# Patient Record
Sex: Female | Born: 1995 | Race: Black or African American | Hispanic: No | Smoking: Former smoker
Health system: Southern US, Community
[De-identification: ages and names within clinical notes are randomized; demographics above are authoritative.]

## PROBLEM LIST (undated history)

## (undated) ENCOUNTER — Inpatient Hospital Stay (HOSPITAL_COMMUNITY): Payer: Self-pay

## (undated) DIAGNOSIS — E669 Obesity, unspecified: Secondary | ICD-10-CM

## (undated) DIAGNOSIS — Z789 Other specified health status: Secondary | ICD-10-CM

## (undated) HISTORY — PX: NO PAST SURGERIES: SHX2092

---
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
## 2016-08-08 ENCOUNTER — Emergency Department (HOSPITAL_BASED_OUTPATIENT_CLINIC_OR_DEPARTMENT_OTHER)
Admission: EM | Admit: 2016-08-08 | Discharge: 2016-08-08 | Discharge status: Against Medical Advice | Payer: Medicaid Other | Attending: Emergency Medicine | Admitting: Emergency Medicine

## 2016-08-08 ENCOUNTER — Encounter (HOSPITAL_BASED_OUTPATIENT_CLINIC_OR_DEPARTMENT_OTHER): Payer: Self-pay | Admitting: *Deleted

## 2016-08-08 DIAGNOSIS — R300 Dysuria: Secondary | ICD-10-CM | POA: Diagnosis not present

## 2016-08-08 DIAGNOSIS — F17200 Nicotine dependence, unspecified, uncomplicated: Secondary | ICD-10-CM | POA: Diagnosis not present

## 2016-08-08 DIAGNOSIS — M549 Dorsalgia, unspecified: Secondary | ICD-10-CM | POA: Diagnosis not present

## 2016-08-08 DIAGNOSIS — N898 Other specified noninflammatory disorders of vagina: Secondary | ICD-10-CM | POA: Insufficient documentation

## 2016-08-08 DIAGNOSIS — R350 Frequency of micturition: Secondary | ICD-10-CM | POA: Insufficient documentation

## 2016-08-08 LAB — URINALYSIS, ROUTINE W REFLEX MICROSCOPIC
Bilirubin Urine: NEGATIVE
GLUCOSE, UA: NEGATIVE mg/dL
Hgb urine dipstick: NEGATIVE
Ketones, ur: NEGATIVE mg/dL
NITRITE: NEGATIVE
PROTEIN: NEGATIVE mg/dL
Specific Gravity, Urine: 1.023 (ref 1.005–1.030)
pH: 7 (ref 5.0–8.0)

## 2016-08-08 LAB — URINE MICROSCOPIC-ADD ON

## 2016-08-08 LAB — WET PREP, GENITAL
Clue Cells Wet Prep HPF POC: NONE SEEN
SPERM: NONE SEEN
Trich, Wet Prep: NONE SEEN
Yeast Wet Prep HPF POC: NONE SEEN

## 2016-08-08 LAB — PREGNANCY, URINE: PREG TEST UR: NEGATIVE

## 2016-08-08 NOTE — ED Provider Notes (Signed)
[facility] Provider Note   CSN: [id number] Arrival date & time: 08/08/16  1916   By signing my name below, I, Christel MormonMatthew Jamison, attest that this documentation has been prepared under the direction and in the presence of Lavera Guiseana Duo Henriette Hesser, MD . Electronically Signed: Christel MormonMatthew Jamison, Scribe. 08/08/2016. 9:26 PM.   History   Chief Complaint Chief Complaint  Patient presents with  . Abdominal Pain    The history is provided by the patient. No language interpreter was used.  Vaginal Itching  This is a new problem. The current episode started more than 2 days ago. Episode frequency: intermittently. The problem has not changed since onset.Associated symptoms include abdominal pain. Nothing aggravates the symptoms. Nothing relieves the symptoms. She has tried nothing for the symptoms.   HPI Comments:  Jeanette [name] is a 20 y.o. female who presents to the Emergency Department with multiple complaints. Pt complains of vaginal itching and intermittent lower suprapbubic abdominal x 1 week. Pt is currently sexually active and has unprotected sex with 1 partner. No concern for STDs. Pt complains of associated dysuria and urinary frequency. Pt denies vaginal discharge, nausea, vomiting, diarrhea, fever or chills. Pt also complains of dry skin on her face the has been intermittently peeling for 3 months. Pt reports associated itchiness. Pt states that she frequently picks at her face. Pt has been using Cetaphil with some relief. Pt denies using any new creams.     Past Medical History:  Diagnosis Date  . Medical history non-contributory     There are no active problems to display for this patient.   Past Surgical History:  Procedure Laterality Date  . NO PAST SURGERIES      OB History    Gravida Para Term Preterm AB Living   0 0 0 0 0 0   SAB TAB Ectopic Multiple Live Births   0 0 0 0         Home Medications    Prior to Admission medications   Medication Sig Start Date  End Date Taking? Authorizing Provider  omeprazole (PRILOSEC) 20 MG capsule Take 1 capsule (20 mg total) by mouth daily. 12/04/15   Judeth HornErin Lawrence, NP  promethazine (PHENERGAN) 25 MG tablet Take 1 tablet (25 mg total) by mouth every 6 (six) hours as needed for nausea or vomiting. 12/04/15   Judeth HornErin Lawrence, NP    Family History No family history on file.  Reviewed. Not contributory.  Social History Social History  Substance Use Topics  . Smoking status: Current Every Day Smoker  . Smokeless tobacco: Never Used  . Alcohol use No     Allergies   Review of patient's allergies indicates no known allergies.   Review of Systems Review of Systems  Gastrointestinal: Positive for abdominal pain. Negative for diarrhea, nausea and vomiting.  Genitourinary: Positive for dysuria, frequency and vaginal pain. Negative for vaginal discharge.  Musculoskeletal: Positive for back pain.  Skin: Positive for rash.  All other systems reviewed and are negative.    Physical Exam Updated Vital Signs BP 131/73 (BP Location: Left Arm)   Pulse 77   Temp 98.3 F (36.8 C) (Oral)   Resp 16   Ht 5\' 3"  (1.6 m)   Wt 203 lb (92.1 kg)   SpO2 98%   BMI 35.96 kg/m   Physical Exam Physical Exam  Nursing note and vitals reviewed. Constitutional: Well developed, well nourished, non-toxic, and in no acute distress Head: Normocephalic and atraumatic.  Mouth/Throat: Oropharynx is clear  and moist.  Neck: Normal range of motion. Neck supple.  Cardiovascular: Normal rate and regular rhythm.   Pulmonary/Chest: Effort normal and breath sounds normal.  Abdominal: Soft. Minimal suprapubic tenderness to palpation. There is no rebound and no guarding.  Musculoskeletal: Normal range of motion.  Neurological: Alert, no facial droop, fluent speech, moves all extremities symmetrically Skin: Skin is warm and dry.  Psychiatric: Cooperative Pelvic: Normal external genitalia. Normal internal genitalia. White vaginal  discharge. No blood within the vagina. No cervical motion tenderness. No adnexal masses or tenderness.   ED Treatments / Results  DIAGNOSTIC STUDIES:  Oxygen Saturation is 98% on RA, normal by my interpretation.    COORDINATION OF CARE:  9:26 PM Discussed treatment plan with pt at bedside and pt agreed to plan.   Labs (all labs ordered are listed, but only abnormal results are displayed) Labs Reviewed  URINALYSIS, ROUTINE W REFLEX MICROSCOPIC (NOT AT [facility]) - Abnormal; Notable for the following:       Result Value   APPearance CLOUDY (*)    Leukocytes, UA SMALL (*)    All other components within normal limits  URINE MICROSCOPIC-ADD ON - Abnormal; Notable for the following:    Squamous Epithelial / LPF 0-5 (*)    Bacteria, UA FEW (*)    All other components within normal limits  WET PREP, GENITAL  PREGNANCY, URINE  GC/CHLAMYDIA PROBE AMP ([place]) NOT AT [facility]    EKG  EKG Interpretation None       Radiology No results found.  Procedures Procedures (including critical care time)  Medications Ordered in ED Medications - No data to display   Initial Impression / Assessment and Plan / ED Course  I have reviewed the triage vital signs and the nursing notes.  Pertinent labs & imaging results that were available during my care of the patient were reviewed by me and considered in my medical decision making (see chart for details).  Clinical Course    Presenting with vaginal itching, intermittent suprapubic pain and dysuria. Abdomen soft and benign. Vital signs stable. DDX include STD, yeast, UTI, BV.   UA not convincing for UTI and sent for culture. Wet prep negative with STD testing pending. Does not want empiric treatment. While waiting for results, patient eloped.   Final Clinical Impressions(s) / ED Diagnoses   Final diagnoses:  None    New Prescriptions New Prescriptions   No medications on file   Strict return and follow-up instructions reviewed.  She/He expressed understanding of all discharge instructions and felt comfortable with the plan of care.     Lavera Guise, MD 08/08/16 [phone]

## 2016-08-08 NOTE — ED Triage Notes (Signed)
Pt is here for sharp lower abdominal and vaginal itching x1 week.  Some pain and burning with urination. Pt reports recent unprotected sex  Pt also is concerned about her dry skin on her face which is peeling and has been this way for 3 months.  Pt states that the dry skin on her face is her primary reason for coming in

## 2016-08-09 LAB — GC/CHLAMYDIA PROBE AMP (~~LOC~~) NOT AT ARMC
Chlamydia: NEGATIVE
Neisseria Gonorrhea: NEGATIVE

## 2017-01-09 ENCOUNTER — Emergency Department (HOSPITAL_COMMUNITY)
Admission: EM | Admit: 2017-01-09 | Discharge: 2017-01-09 | Disposition: A | Discharge status: Home / Self Care | Payer: Medicaid Other | Attending: Emergency Medicine | Admitting: Emergency Medicine

## 2017-01-09 ENCOUNTER — Encounter (HOSPITAL_COMMUNITY): Payer: Self-pay | Admitting: *Deleted

## 2017-01-09 DIAGNOSIS — L600 Ingrowing nail: Secondary | ICD-10-CM

## 2017-01-09 DIAGNOSIS — F17200 Nicotine dependence, unspecified, uncomplicated: Secondary | ICD-10-CM | POA: Insufficient documentation

## 2017-01-09 MED ORDER — SULFAMETHOXAZOLE-TRIMETHOPRIM 800-160 MG PO TABS: 1.0000 | ORAL_TABLET | Freq: Two times a day (BID) | ORAL | 0 refills | Status: AC

## 2017-01-09 NOTE — ED Triage Notes (Signed)
Pt states tried to remove ingrown toenail to R great toe and now it is red and swollen.  Also c/o red marks/dry skin to breasts.

## 2017-01-09 NOTE — ED Provider Notes (Signed)
[facility] Provider Note   CSN: [id number] Arrival date & time: 01/09/17  7829     History   Chief Complaint Chief Complaint  Patient presents with  . Toe Pain    HPI Jeanette [name] is a 21 y.o. female.  The history is provided by the patient. No language interpreter was used.  Toe Pain  This is a recurrent problem. The problem occurs constantly. Nothing aggravates the symptoms. Nothing relieves the symptoms. She has tried nothing for the symptoms. The treatment provided no relief.  Pt reports she has   Past Medical History:  Diagnosis Date  . Medical history non-contributory     There are no active problems to display for this patient.   Past Surgical History:  Procedure Laterality Date  . NO PAST SURGERIES      OB History    Gravida Para Term Preterm AB Living   0 0 0 0 0 0   SAB TAB Ectopic Multiple Live Births   0 0 0 0         Home Medications    Prior to Admission medications   Medication Sig Start Date End Date Taking? Authorizing Provider  omeprazole (PRILOSEC) 20 MG capsule Take 1 capsule (20 mg total) by mouth daily. 12/04/15   Judeth Horn, NP  promethazine (PHENERGAN) 25 MG tablet Take 1 tablet (25 mg total) by mouth every 6 (six) hours as needed for nausea or vomiting. 12/04/15   Judeth Horn, NP  sulfamethoxazole-trimethoprim (BACTRIM DS,SEPTRA DS) 800-160 MG tablet Take 1 tablet by mouth 2 (two) times daily. 01/09/17 01/16/17  Elson Areas, PA-C    Family History No family history on file.  Social History Social History  Substance Use Topics  . Smoking status: Current Every Day Smoker  . Smokeless tobacco: Never Used  . Alcohol use No     Allergies   Patient has no known allergies.   Review of Systems Review of Systems  All other systems reviewed and are negative.    Physical Exam Updated Vital Signs BP 149/80 (BP Location: Right Arm)   Pulse 80   Temp 99.7 F (37.6 C) (Oral)   Resp 16   Ht 5\' 3"  (1.6 m)    Wt 92.1 kg   LMP 12/09/2016   SpO2 99%   BMI 35.96 kg/m   Physical Exam  Musculoskeletal: She exhibits tenderness.  Deformity right 1st toe,  ? Atrophic nail.  Erythematous.    Neurological: She is alert.  Skin: Skin is warm.  Psychiatric: She has a normal mood and affect.     ED Treatments / Results  Labs (all labs ordered are listed, but only abnormal results are displayed) Labs Reviewed - No data to display  EKG  EKG Interpretation None       Radiology No results found.  Procedures Procedures (including critical care time)  Medications Ordered in ED Medications - No data to display   Initial Impression / Assessment and Plan / ED Course  I have reviewed the triage vital signs and the nursing notes.  Pertinent labs & imaging results that were available during my care of the patient were reviewed by me and considered in my medical decision making (see chart for details).     I do not think removing nail will help.  Pt given rx rto bactrim and advised to see Podiatrist for treatment.  Final Clinical Impressions(s) / ED Diagnoses   Final diagnoses:  Ingrown right big toenail  New Prescriptions New Prescriptions   SULFAMETHOXAZOLE-TRIMETHOPRIM (BACTRIM DS,SEPTRA DS) 800-160 MG TABLET    Take 1 tablet by mouth 2 (two) times daily.    An After Visit Summary was printed and given to the patient. Elson AreasLeslie K Jeylin Woodmansee, PA-C 01/09/17 0912    Elson AreasLeslie K Crysta Gulick, PA-C 01/09/17 [phone]    Marily MemosJason Mesner, MD 01/09/17 [phone]

## 2017-01-11 ENCOUNTER — Ambulatory Visit: Payer: Medicaid Other | Admitting: Podiatry

## 2017-07-19 ENCOUNTER — Encounter (HOSPITAL_COMMUNITY): Payer: Self-pay | Admitting: *Deleted

## 2017-07-19 ENCOUNTER — Inpatient Hospital Stay (HOSPITAL_COMMUNITY)
Admission: AD | Admit: 2017-07-19 | Discharge: 2017-07-19 | Discharge status: Against Medical Advice | Payer: Medicaid Other | Source: Ambulatory Visit | Attending: Obstetrics and Gynecology | Admitting: Obstetrics and Gynecology

## 2017-07-19 DIAGNOSIS — Z5321 Procedure and treatment not carried out due to patient leaving prior to being seen by health care provider: Secondary | ICD-10-CM | POA: Insufficient documentation

## 2017-07-19 LAB — URINALYSIS, ROUTINE W REFLEX MICROSCOPIC
Bilirubin Urine: NEGATIVE
GLUCOSE, UA: NEGATIVE mg/dL
Ketones, ur: 80 mg/dL — AB
Leukocytes, UA: NEGATIVE
Nitrite: NEGATIVE
PH: 5 (ref 5.0–8.0)
Protein, ur: NEGATIVE mg/dL
SPECIFIC GRAVITY, URINE: 1.021 (ref 1.005–1.030)

## 2017-07-19 LAB — POCT PREGNANCY, URINE: Preg Test, Ur: POSITIVE — AB

## 2017-07-19 NOTE — MAU Note (Signed)
Pt C/O nausea & being lightheaded for the past week.  Has vomited once.  Had pos HPT yesterday.  Has lower abd & back pain, denies bleeding.

## 2017-07-19 NOTE — MAU Note (Signed)
Registration staff called to say that pt decided to leave AMA without signing AMA paperwork

## 2017-07-22 ENCOUNTER — Inpatient Hospital Stay (HOSPITAL_COMMUNITY)
Admission: AD | Admit: 2017-07-22 | Discharge: 2017-07-22 | Disposition: A | Discharge status: Home / Self Care | Payer: Self-pay | Source: Ambulatory Visit | Attending: Obstetrics & Gynecology | Admitting: Obstetrics & Gynecology

## 2017-07-22 ENCOUNTER — Encounter (HOSPITAL_COMMUNITY): Payer: Self-pay | Admitting: *Deleted

## 2017-07-22 ENCOUNTER — Inpatient Hospital Stay (HOSPITAL_COMMUNITY): Payer: Self-pay

## 2017-07-22 DIAGNOSIS — O219 Vomiting of pregnancy, unspecified: Secondary | ICD-10-CM | POA: Insufficient documentation

## 2017-07-22 DIAGNOSIS — R109 Unspecified abdominal pain: Secondary | ICD-10-CM

## 2017-07-22 DIAGNOSIS — O200 Threatened abortion: Secondary | ICD-10-CM | POA: Insufficient documentation

## 2017-07-22 DIAGNOSIS — O26899 Other specified pregnancy related conditions, unspecified trimester: Secondary | ICD-10-CM | POA: Diagnosis present

## 2017-07-22 DIAGNOSIS — Z87891 Personal history of nicotine dependence: Secondary | ICD-10-CM | POA: Insufficient documentation

## 2017-07-22 DIAGNOSIS — K5900 Constipation, unspecified: Secondary | ICD-10-CM | POA: Insufficient documentation

## 2017-07-22 DIAGNOSIS — O26891 Other specified pregnancy related conditions, first trimester: Secondary | ICD-10-CM | POA: Insufficient documentation

## 2017-07-22 DIAGNOSIS — O99611 Diseases of the digestive system complicating pregnancy, first trimester: Secondary | ICD-10-CM | POA: Insufficient documentation

## 2017-07-22 DIAGNOSIS — O3680X0 Pregnancy with inconclusive fetal viability, not applicable or unspecified: Secondary | ICD-10-CM | POA: Clinically undetermined

## 2017-07-22 LAB — URINALYSIS, ROUTINE W REFLEX MICROSCOPIC
BILIRUBIN URINE: NEGATIVE
Bacteria, UA: NONE SEEN
Glucose, UA: NEGATIVE mg/dL
Ketones, ur: 5 mg/dL — AB
LEUKOCYTES UA: NEGATIVE
NITRITE: NEGATIVE
PROTEIN: NEGATIVE mg/dL
Specific Gravity, Urine: 1.023 (ref 1.005–1.030)
pH: 5 (ref 5.0–8.0)

## 2017-07-22 LAB — CBC
HCT: 37 % (ref 36.0–46.0)
Hemoglobin: 12.5 g/dL (ref 12.0–15.0)
MCH: 30 pg (ref 26.0–34.0)
MCHC: 33.8 g/dL (ref 30.0–36.0)
MCV: 88.7 fL (ref 78.0–100.0)
PLATELETS: 292 10*3/uL (ref 150–400)
RBC: 4.17 MIL/uL (ref 3.87–5.11)
RDW: 14.1 % (ref 11.5–15.5)
WBC: 8.6 10*3/uL (ref 4.0–10.5)

## 2017-07-22 LAB — COMPREHENSIVE METABOLIC PANEL
ALK PHOS: 40 U/L (ref 38–126)
ALT: 11 U/L — ABNORMAL LOW (ref 14–54)
AST: 19 U/L (ref 15–41)
Albumin: 3.8 g/dL (ref 3.5–5.0)
Anion gap: 7 (ref 5–15)
BILIRUBIN TOTAL: 0.5 mg/dL (ref 0.3–1.2)
BUN: 10 mg/dL (ref 6–20)
CALCIUM: 9 mg/dL (ref 8.9–10.3)
CO2: 22 mmol/L (ref 22–32)
Chloride: 107 mmol/L (ref 101–111)
Creatinine, Ser: 0.65 mg/dL (ref 0.44–1.00)
GFR calc non Af Amer: >60 mL/min (ref >60)
Glucose, Bld: 115 mg/dL — ABNORMAL HIGH (ref 65–99)
Potassium: 3.9 mmol/L (ref 3.5–5.1)
Sodium: 136 mmol/L (ref 135–145)
TOTAL PROTEIN: 7.3 g/dL (ref 6.5–8.1)

## 2017-07-22 LAB — WET PREP, GENITAL
Clue Cells Wet Prep HPF POC: NONE SEEN
Sperm: NONE SEEN
TRICH WET PREP: NONE SEEN
Yeast Wet Prep HPF POC: NONE SEEN

## 2017-07-22 LAB — ABO/RH: ABO/RH(D): A POS

## 2017-07-22 LAB — HCG, QUANTITATIVE, PREGNANCY: HCG, BETA CHAIN, QUANT, S: 16734 m[IU]/mL — AB (ref <5)

## 2017-07-22 MED ORDER — ACETAMINOPHEN 500 MG PO TABS
1000.0000 mg | ORAL_TABLET | Freq: Once | ORAL | Status: AC
  Administered 2017-07-22: 1000 mg via ORAL
  Filled 2017-07-22: qty 2

## 2017-07-22 MED ORDER — METOCLOPRAMIDE HCL 10 MG PO TABS: 10.0000 mg | ORAL_TABLET | Freq: Four times a day (QID) | ORAL | 0 refills | Status: DC

## 2017-07-22 MED ORDER — PRENATAL VITAMINS 28-0.8 MG PO TABS: 1.0000 | ORAL_TABLET | Freq: Every day | ORAL | 6 refills | Status: DC

## 2017-07-22 NOTE — MAU Provider Note (Signed)
History     CSN: [id number]  Arrival date and time: 07/22/17 1044   First Provider Initiated Contact with Patient 07/22/17 1124      Chief Complaint  Patient presents with  . Emesis  . Abdominal Pain  . Constipation   HPI  [name] is 21 yo G1P0 at 6.[redacted] wks gestation presenting with complaints of being "very nauseated", vomiting (3x today), constipation, and abdominal pain since yesterday.  She denies VB, dysuria or diarrhea.  She reports her nausea was worse this AM, but it's better now.  Past Medical History:  Diagnosis Date  . Medical history non-contributory     Past Surgical History:  Procedure Laterality Date  . NO PAST SURGERIES      History reviewed. No pertinent family history.  Social History  Substance Use Topics  . Smoking status: Former Smoker    Quit date: 07/15/2017  . Smokeless tobacco: Never Used  . Alcohol use No    Allergies: No Known Allergies  No prescriptions prior to admission.    Review of Systems  Constitutional: Negative.   HENT: Negative.   Eyes: Negative.   Gastrointestinal: Positive for abdominal pain, constipation, nausea and vomiting.  Endocrine: Negative.   Genitourinary: Negative.   Musculoskeletal: Negative.   Skin: Negative.   Allergic/Immunologic: Negative.   Neurological: Negative.   Hematological: Negative.   Psychiatric/Behavioral: Negative.    Physical Exam   Blood pressure (!) 112/52, pulse 87, temperature 99.3 F (37.4 C), temperature source Oral, resp. rate 18, last menstrual period 06/10/2017.  Physical Exam  Constitutional: She is oriented to person, place, and time. She appears well-developed and well-nourished.  HENT:  Head: Normocephalic.  Eyes: Pupils are equal, round, and reactive to light.  Neck: Normal range of motion.  Cardiovascular: Normal rate, regular rhythm, normal heart sounds and intact distal pulses.   Respiratory: Effort normal and breath sounds normal.  GI: Soft. Bowel  sounds are decreased.  Genitourinary: Vagina normal and uterus normal.  Genitourinary Comments: Uterus: enlarged, cx; smooth, pink, no lesions, scant amt of thick, white d/c, closed/long/firm, no CMT or friability, no adnexal tenderness  Musculoskeletal: Normal range of motion.  Neurological: She is alert and oriented to person, place, and time. She has normal reflexes.  Skin: Skin is warm and dry.  Psychiatric: She has a normal mood and affect. Her behavior is normal. Judgment and thought content normal.    MAU Course  Procedures  MDM CCUA CBC CMP OB U/S < 14wks OB Transvaginal U/S Wet Prep GC/CT  HIV  Results for orders placed or performed during the hospital encounter of 07/22/17 (from the past 24 hour(s))  Urinalysis, Routine w reflex microscopic     Status: Abnormal   Collection Time: 07/22/17 10:53 AM  Result Value Ref Range   Color, Urine YELLOW YELLOW   APPearance CLEAR CLEAR   Specific Gravity, Urine 1.023 1.005 - 1.030   pH 5.0 5.0 - 8.0   Glucose, UA NEGATIVE NEGATIVE mg/dL   Hgb urine dipstick SMALL (A) NEGATIVE   Bilirubin Urine NEGATIVE NEGATIVE   Ketones, ur 5 (A) NEGATIVE mg/dL   Protein, ur NEGATIVE NEGATIVE mg/dL   Nitrite NEGATIVE NEGATIVE   Leukocytes, UA NEGATIVE NEGATIVE   RBC / HPF 0-5 0 - 5 RBC/hpf   WBC, UA 0-5 0 - 5 WBC/hpf   Bacteria, UA NONE SEEN NONE SEEN   Squamous Epithelial / LPF 0-5 (A) NONE SEEN   Mucus PRESENT   Wet prep, genital  Status: Abnormal   Collection Time: 07/22/17 11:25 AM  Result Value Ref Range   Yeast Wet Prep HPF POC NONE SEEN NONE SEEN   Trich, Wet Prep NONE SEEN NONE SEEN   Clue Cells Wet Prep HPF POC NONE SEEN NONE SEEN   WBC, Wet Prep HPF POC FEW (A) NONE SEEN   Sperm NONE SEEN   CBC     Status: None   Collection Time: 07/22/17 11:36 AM  Result Value Ref Range   WBC 8.6 4.0 - 10.5 K/uL   RBC 4.17 3.87 - 5.11 MIL/uL   Hemoglobin 12.5 12.0 - 15.0 g/dL   HCT 21.3 08.6 - 57.8 %   MCV 88.7 78.0 - 100.0 fL    MCH 30.0 26.0 - 34.0 pg   MCHC 33.8 30.0 - 36.0 g/dL   RDW 46.9 62.9 - 52.8 %   Platelets 292 150 - 400 K/uL  ABO/Rh     Status: None (Preliminary result)   Collection Time: 07/22/17 11:36 AM  Result Value Ref Range   ABO/RH(D) A POS   hCG, quantitative, pregnancy     Status: Abnormal   Collection Time: 07/22/17 11:36 AM  Result Value Ref Range   hCG, Beta Chain, Quant, S 16,734 (H) <5 mIU/mL  Comprehensive metabolic panel     Status: Abnormal   Collection Time: 07/22/17 11:36 AM  Result Value Ref Range   Sodium 136 135 - 145 mmol/L   Potassium 3.9 3.5 - 5.1 mmol/L   Chloride 107 101 - 111 mmol/L   CO2 22 22 - 32 mmol/L   Glucose, Bld 115 (H) 65 - 99 mg/dL   BUN 10 6 - 20 mg/dL   Creatinine, Ser 4.13 0.44 - 1.00 mg/dL   Calcium 9.0 8.9 - 24.4 mg/dL   Total Protein 7.3 6.5 - 8.1 g/dL   Albumin 3.8 3.5 - 5.0 g/dL   AST 19 15 - 41 U/L   ALT 11 (L) 14 - 54 U/L   Alkaline Phosphatase 40 38 - 126 U/L   Total Bilirubin 0.5 0.3 - 1.2 mg/dL   GFR calc non Af Amer >60 >60 mL/min   GFR calc Af Amer >60 >60 mL/min   Anion gap 7 5 - 15   US Ob Comp Less 14 Wks  Result Date: 07/22/2017 CLINICAL DATA:  Abdominal pain, constipation, nausea and vomiting, positive pregnancy test. Quantitative beta HCG 16,734 EXAM: OBSTETRIC <14 WK [place] AND TRANSVAGINAL OB US TECHNIQUE: Both transabdominal and transvaginal ultrasound examinations were performed for complete evaluation of the gestation as well as the maternal uterus, adnexal regions, and pelvic cul-de-sac. Transvaginal technique was performed to assess early pregnancy. COMPARISON:  None available FINDINGS: Intrauterine gestational sac: Single Yolk sac:  Visualized. Embryo:  Not Visualized. Cardiac Activity: Not Visualized. Heart Rate: Not detected MSD: 1.22 cm  6 w   0  d Subchorionic hemorrhage:  None visualized. Maternal uterus/adnexae: Normal ovaries.  No free fluid. Elongated intrauterine gestational sac with a visualized yolk sac but no fetal pole.  Mean sac diameter 1.2 cm. This correlates with a 6 week 0 day gestational age IMPRESSION: Single intrauterine early pregnancy with a 6 week 0 day gestational age by mean sac diameter as above. Gestational sac is noted to be somewhat elongated, remaining nonspecific at this early stage. Recommend follow-up quantitative B-HCG levels and follow-up US in 14 days to assess viability. This recommendation follows SRU consensus guidelines: Diagnostic Criteria for Nonviable Pregnancy Early in the First Trimester. N Engl J  Med 2013; 161:0960-45. Electronically Signed   By: Judie Petit.  Shick M.D.   On: 07/22/2017 13:02   US Ob Transvaginal  Result Date: 07/22/2017 CLINICAL DATA:  Abdominal pain, constipation, nausea and vomiting, positive pregnancy test. Quantitative beta HCG 16,734 EXAM: OBSTETRIC <14 WK [place] AND TRANSVAGINAL OB US TECHNIQUE: Both transabdominal and transvaginal ultrasound examinations were performed for complete evaluation of the gestation as well as the maternal uterus, adnexal regions, and pelvic cul-de-sac. Transvaginal technique was performed to assess early pregnancy. COMPARISON:  None available FINDINGS: Intrauterine gestational sac: Single Yolk sac:  Visualized. Embryo:  Not Visualized. Cardiac Activity: Not Visualized. Heart Rate: Not detected MSD: 1.22 cm  6 w   0  d Subchorionic hemorrhage:  None visualized. Maternal uterus/adnexae: Normal ovaries.  No free fluid. Elongated intrauterine gestational sac with a visualized yolk sac but no fetal pole. Mean sac diameter 1.2 cm. This correlates with a 6 week 0 day gestational age IMPRESSION: Single intrauterine early pregnancy with a 6 week 0 day gestational age by mean sac diameter as above. Gestational sac is noted to be somewhat elongated, remaining nonspecific at this early stage. Recommend follow-up quantitative B-HCG levels and follow-up US in 14 days to assess viability. This recommendation follows SRU consensus guidelines: Diagnostic Criteria for  Nonviable Pregnancy Early in the First Trimester. Malva Limes Med 2013; 409:8119-14. Electronically Signed   By: Judie Petit.  Shick M.D.   On: 07/22/2017 13:02    Assessment and Plan  Abdominal pain affecting pregnancy - Threatened miscarriage, abd pain in pregnancy and first trimester pregnancy instructions given - Advised to return to MAU for increased abd pain not relieved by Tylenol - Rx PNV and Reglan sent  Pregnancy with uncertain fetal viability, single or unspecified fetus - Plan: F/U OB U/S in 2 wks - See [facility] provider after U/S in 2 wks   Discharge home Patient verbalized an understanding of the plan of care and agrees.   Raelyn Mora, MSN, CNM 07/22/2017, 11:25 AM

## 2017-07-22 NOTE — MAU Note (Signed)
Pt has been very nauseated, has vomited three times today.  Denies diarrhea, is constipated.  Last BM was 3 days ago.  Lower abd pain since yesterday, denies bleeding.

## 2017-07-22 NOTE — Discharge Instructions (Signed)
[facility]  ° ° °[facility]       Phone: [phone] ° °[facility] Phone: [phone] ° °[facility] [place]  Phone: [phone] ° °[facility] [place]  Phone: [phone] ° °[facility] [place]  Phone: [phone] ° °[facility]       Phone: [phone] ° °[facility]    Phone: [phone]  ° °[facility]    Phone: [phone] ° °[facility]    Phone: [phone] ° °[facility]    Phone: [phone] ° °[facility]    Phone: [phone] ° °[facility]       Phone: [phone]  ° °[facility]  Phone: [phone] ° °[facility]    Phone: [phone] ° °[facility]   Phone: [phone] ° °[facility]      Phone: [phone] ° °[facility]    Phone: [phone] ° °

## 2017-07-23 LAB — HIV ANTIBODY (ROUTINE TESTING W REFLEX): HIV Screen 4th Generation wRfx: NONREACTIVE

## 2017-07-27 LAB — GC/CHLAMYDIA PROBE AMP (~~LOC~~) NOT AT ARMC
Chlamydia: NEGATIVE
Neisseria Gonorrhea: NEGATIVE

## 2017-07-31 ENCOUNTER — Ambulatory Visit (INDEPENDENT_AMBULATORY_CARE_PROVIDER_SITE_OTHER): Payer: Self-pay | Admitting: Medical

## 2017-07-31 ENCOUNTER — Ambulatory Visit (HOSPITAL_COMMUNITY)
Admission: RE | Admit: 2017-07-31 | Discharge: 2017-07-31 | Disposition: A | Discharge status: Home / Self Care | Payer: Self-pay | Source: Ambulatory Visit | Attending: Obstetrics and Gynecology | Admitting: Obstetrics and Gynecology

## 2017-07-31 ENCOUNTER — Encounter: Payer: Self-pay | Admitting: Medical

## 2017-07-31 ENCOUNTER — Encounter: Payer: Self-pay | Admitting: General Practice

## 2017-07-31 VITALS — BP 122/55 | HR 75 | Ht 63.0 in | Wt 212.3 lb

## 2017-07-31 DIAGNOSIS — Z712 Person consulting for explanation of examination or test findings: Secondary | ICD-10-CM

## 2017-07-31 DIAGNOSIS — O26891 Other specified pregnancy related conditions, first trimester: Secondary | ICD-10-CM | POA: Insufficient documentation

## 2017-07-31 DIAGNOSIS — O26899 Other specified pregnancy related conditions, unspecified trimester: Secondary | ICD-10-CM

## 2017-07-31 DIAGNOSIS — R109 Unspecified abdominal pain: Secondary | ICD-10-CM

## 2017-07-31 DIAGNOSIS — Z3A01 Less than 8 weeks gestation of pregnancy: Secondary | ICD-10-CM | POA: Insufficient documentation

## 2017-07-31 NOTE — Progress Notes (Signed)
Patient here for viability results today. Reviewed results with Vonzella NippleJulie Wenzel who agrees with viable IUP. Patient should begin PNV & care. Informed patient of results, dating, provided pictures & pregnancy confirmation. Patient verbalized understanding and states she is only taking PNV & nausea medication we gave her. Encouraged patient to start new OB care. Patient verbalized understanding & had no questions

## 2017-07-31 NOTE — Progress Notes (Signed)
Agree with nursing staff's documentation of this patient's clinic encounter.  Fradel Baldonado, PA-C    

## 2017-08-01 ENCOUNTER — Encounter (HOSPITAL_COMMUNITY): Payer: Self-pay

## 2017-08-01 ENCOUNTER — Inpatient Hospital Stay (HOSPITAL_COMMUNITY)
Admission: AD | Admit: 2017-08-01 | Discharge: 2017-08-01 | Disposition: A | Discharge status: Home / Self Care | Payer: Medicaid Other | Source: Ambulatory Visit | Attending: Family Medicine | Admitting: Family Medicine

## 2017-08-01 DIAGNOSIS — Z87891 Personal history of nicotine dependence: Secondary | ICD-10-CM | POA: Insufficient documentation

## 2017-08-01 DIAGNOSIS — W010XXA Fall on same level from slipping, tripping and stumbling without subsequent striking against object, initial encounter: Secondary | ICD-10-CM | POA: Insufficient documentation

## 2017-08-01 DIAGNOSIS — W19XXXA Unspecified fall, initial encounter: Secondary | ICD-10-CM

## 2017-08-01 DIAGNOSIS — O26891 Other specified pregnancy related conditions, first trimester: Secondary | ICD-10-CM | POA: Insufficient documentation

## 2017-08-01 DIAGNOSIS — R109 Unspecified abdominal pain: Secondary | ICD-10-CM | POA: Insufficient documentation

## 2017-08-01 DIAGNOSIS — O26899 Other specified pregnancy related conditions, unspecified trimester: Secondary | ICD-10-CM

## 2017-08-01 DIAGNOSIS — Z3A01 Less than 8 weeks gestation of pregnancy: Secondary | ICD-10-CM

## 2017-08-01 NOTE — Discharge Instructions (Signed)
First Trimester of Pregnancy The first trimester of pregnancy is from week 1 until the end of week 13 (months 1 through 3). A week after a sperm fertilizes an egg, the egg will implant on the wall of the uterus. This embryo will begin to develop into a baby. Genes from you and your partner will form the baby. The female genes will determine whether the baby will be a boy or a girl. At 6-8 weeks, the eyes and face will be formed, and the heartbeat can be seen on ultrasound. At the end of 12 weeks, all the baby's organs will be formed. Now that you are pregnant, you will want to do everything you can to have a healthy baby. Two of the most important things are to get good prenatal care and to follow your health care provider's instructions. Prenatal care is all the medical care you receive before the baby's birth. This care will help prevent, find, and treat any problems during the pregnancy and childbirth. Body changes during your first trimester Your body goes through many changes during pregnancy. The changes vary from woman to woman.  You may gain or lose a couple of pounds at first.  You may feel sick to your stomach (nauseous) and you may throw up (vomit). If the vomiting is uncontrollable, call your health care provider.  You may tire easily.  You may develop headaches that can be relieved by medicines. All medicines should be approved by your health care provider.  You may urinate more often. Painful urination may mean you have a bladder infection.  You may develop heartburn as a result of your pregnancy.  You may develop constipation because certain hormones are causing the muscles that push stool through your intestines to slow down.  You may develop hemorrhoids or swollen veins (varicose veins).  Your breasts may begin to grow larger and become tender. Your nipples may stick out more, and the tissue that surrounds them (areola) may become darker.  Your gums may bleed and may be  sensitive to brushing and flossing.  Dark spots or blotches (chloasma, mask of pregnancy) may develop on your face. This will likely fade after the baby is born.  Your menstrual periods will stop.  You may have a loss of appetite.  You may develop cravings for certain kinds of food.  You may have changes in your emotions from day to day, such as being excited to be pregnant or being concerned that something may go wrong with the pregnancy and baby.  You may have more vivid and strange dreams.  You may have changes in your hair. These can include thickening of your hair, rapid growth, and changes in texture. Some women also have hair loss during or after pregnancy, or hair that feels dry or thin. Your hair will most likely return to normal after your baby is born.  What to expect at prenatal visits During a routine prenatal visit:  You will be weighed to make sure you and the baby are growing normally.  Your blood pressure will be taken.  Your abdomen will be measured to track your baby's growth.  The fetal heartbeat will be listened to between weeks 10 and 14 of your pregnancy.  Test results from any previous visits will be discussed.  Your health care provider may ask you:  How you are feeling.  If you are feeling the baby move.  If you have had any abnormal symptoms, such as leaking fluid, bleeding, severe headaches,   or abdominal cramping.  If you are using any tobacco products, including cigarettes, chewing tobacco, and electronic cigarettes.  If you have any questions.  Other tests that may be performed during your first trimester include:  Blood tests to find your blood type and to check for the presence of any previous infections. The tests will also be used to check for low iron levels (anemia) and protein on red blood cells (Rh antibodies). Depending on your risk factors, or if you previously had diabetes during pregnancy, you may have tests to check for high blood  sugar that affects pregnant women (gestational diabetes).  Urine tests to check for infections, diabetes, or protein in the urine.  An ultrasound to confirm the proper growth and development of the baby.  Fetal screens for spinal cord problems (spina bifida) and Down syndrome.  HIV (human immunodeficiency virus) testing. Routine prenatal testing includes screening for HIV, unless you choose not to have this test.  You may need other tests to make sure you and the baby are doing well.  Follow these instructions at home: Medicines  Follow your health care provider's instructions regarding medicine use. Specific medicines may be either safe or unsafe to take during pregnancy.  Take a prenatal vitamin that contains at least 600 micrograms (mcg) of folic acid.  If you develop constipation, try taking a stool softener if your health care provider approves. Eating and drinking  Eat a balanced diet that includes fresh fruits and vegetables, whole grains, good sources of protein such as meat, eggs, or tofu, and low-fat dairy. Your health care provider will help you determine the amount of weight gain that is right for you.  Avoid raw meat and uncooked cheese. These carry germs that can cause birth defects in the baby.  Eating four or five small meals rather than three large meals a day may help relieve nausea and vomiting. If you start to feel nauseous, eating a few soda crackers can be helpful. Drinking liquids between meals, instead of during meals, also seems to help ease nausea and vomiting.  Limit foods that are high in fat and processed sugars, such as fried and sweet foods.  To prevent constipation: ? Eat foods that are high in fiber, such as fresh fruits and vegetables, whole grains, and beans. ? Drink enough fluid to keep your urine clear or pale yellow. Activity  Exercise only as directed by your health care provider. Most women can continue their usual exercise routine during  pregnancy. Try to exercise for 30 minutes at least 5 days a week. Exercising will help you: ? Control your weight. ? Stay in shape. ? Be prepared for labor and delivery.  Experiencing pain or cramping in the lower abdomen or lower back is a good sign that you should stop exercising. Check with your health care provider before continuing with normal exercises.  Try to avoid standing for long periods of time. Move your legs often if you must stand in one place for a long time.  Avoid heavy lifting.  Wear low-heeled shoes and practice good posture.  You may continue to have sex unless your health care provider tells you not to. Relieving pain and discomfort  Wear a good support bra to relieve breast tenderness.  Take warm sitz baths to soothe any pain or discomfort caused by hemorrhoids. Use hemorrhoid cream if your health care provider approves.  Rest with your legs elevated if you have leg cramps or low back pain.  If you develop   varicose veins in your legs, wear support hose. Elevate your feet for 15 minutes, 3-4 times a day. Limit salt in your diet. Prenatal care  Schedule your prenatal visits by the twelfth week of pregnancy. They are usually scheduled monthly at first, then more often in the last 2 months before delivery.  Write down your questions. Take them to your prenatal visits.  Keep all your prenatal visits as told by your health care provider. This is important. Safety  Wear your seat belt at all times when driving.  Make a list of emergency phone numbers, including numbers for family, friends, the hospital, and police and fire departments. General instructions  Ask your health care provider for a referral to a local prenatal education class. Begin classes no later than the beginning of month 6 of your pregnancy.  Ask for help if you have counseling or nutritional needs during pregnancy. Your health care provider can offer advice or refer you to specialists for help  with various needs.  Do not use hot tubs, steam rooms, or saunas.  Do not douche or use tampons or scented sanitary pads.  Do not cross your legs for long periods of time.  Avoid cat litter boxes and soil used by cats. These carry germs that can cause birth defects in the baby and possibly loss of the fetus by miscarriage or stillbirth.  Avoid all smoking, herbs, alcohol, and medicines not prescribed by your health care provider. Chemicals in these products affect the formation and growth of the baby.  Do not use any products that contain nicotine or tobacco, such as cigarettes and e-cigarettes. If you need help quitting, ask your health care provider. You may receive counseling support and other resources to help you quit.  Schedule a dentist appointment. At home, brush your teeth with a soft toothbrush and be gentle when you floss. Contact a health care provider if:  You have dizziness.  You have mild pelvic cramps, pelvic pressure, or nagging pain in the abdominal area.  You have persistent nausea, vomiting, or diarrhea.  You have a bad smelling vaginal discharge.  You have pain when you urinate.  You notice increased swelling in your face, hands, legs, or ankles.  You are exposed to fifth disease or chickenpox.  You are exposed to German measles (rubella) and have never had it. Get help right away if:  You have a fever.  You are leaking fluid from your vagina.  You have spotting or bleeding from your vagina.  You have severe abdominal cramping or pain.  You have rapid weight gain or loss.  You vomit blood or material that looks like coffee grounds.  You develop a severe headache.  You have shortness of breath.  You have any kind of trauma, such as from a fall or a car accident. Summary  The first trimester of pregnancy is from week 1 until the end of week 13 (months 1 through 3).  Your body goes through many changes during pregnancy. The changes vary from  woman to woman.  You will have routine prenatal visits. During those visits, your health care provider will examine you, discuss any test results you may have, and talk with you about how you are feeling. This information is not intended to replace advice given to you by your health care provider. Make sure you discuss any questions you have with your health care provider. Document Released: 11/01/2001 Document Revised: 10/19/2016 Document Reviewed: 10/19/2016 Elsevier Interactive Patient Education  2017 [employer]   Inc[employer]  
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
## 2017-08-30 ENCOUNTER — Inpatient Hospital Stay (HOSPITAL_COMMUNITY)
Admission: AD | Admit: 2017-08-30 | Discharge: 2017-08-30 | Discharge status: Against Medical Advice | Payer: Medicaid Other | Source: Ambulatory Visit | Attending: Obstetrics and Gynecology | Admitting: Obstetrics and Gynecology

## 2017-08-30 DIAGNOSIS — Z5321 Procedure and treatment not carried out due to patient leaving prior to being seen by health care provider: Secondary | ICD-10-CM | POA: Insufficient documentation

## 2017-08-30 LAB — URINALYSIS, ROUTINE W REFLEX MICROSCOPIC
BILIRUBIN URINE: NEGATIVE
Glucose, UA: NEGATIVE mg/dL
Hgb urine dipstick: NEGATIVE
Ketones, ur: NEGATIVE mg/dL
Nitrite: NEGATIVE
PH: 7 (ref 5.0–8.0)
Protein, ur: NEGATIVE mg/dL
SPECIFIC GRAVITY, URINE: 1.017 (ref 1.005–1.030)

## 2017-08-30 NOTE — MAU Note (Addendum)
States was using the restroom and noticed blood when she wiped--states a small amount was in toilet. Bright red in color. States unsure if vaginal bleeding or not  Lower back pain--discomfort--rating pain 6/10

## 2017-08-30 NOTE — Progress Notes (Signed)
Called pt's name, not in lobby.

## 2017-08-30 NOTE — Progress Notes (Signed)
Called pt, not in lobby.  

## 2017-08-30 NOTE — Progress Notes (Signed)
Called pt's name- not in lobby.

## 2017-09-06 ENCOUNTER — Other Ambulatory Visit (HOSPITAL_COMMUNITY)
Admission: RE | Admit: 2017-09-06 | Discharge: 2017-09-06 | Disposition: A | Discharge status: Home / Self Care | Payer: Medicaid Other | Source: Ambulatory Visit | Attending: Student | Admitting: Student

## 2017-09-06 ENCOUNTER — Ambulatory Visit (INDEPENDENT_AMBULATORY_CARE_PROVIDER_SITE_OTHER): Payer: Self-pay | Admitting: Student

## 2017-09-06 ENCOUNTER — Encounter: Payer: Self-pay | Admitting: Student

## 2017-09-06 DIAGNOSIS — Z3400 Encounter for supervision of normal first pregnancy, unspecified trimester: Secondary | ICD-10-CM

## 2017-09-06 DIAGNOSIS — Z3A00 Weeks of gestation of pregnancy not specified: Secondary | ICD-10-CM | POA: Insufficient documentation

## 2017-09-06 DIAGNOSIS — O0990 Supervision of high risk pregnancy, unspecified, unspecified trimester: Secondary | ICD-10-CM | POA: Insufficient documentation

## 2017-09-06 DIAGNOSIS — Z3401 Encounter for supervision of normal first pregnancy, first trimester: Secondary | ICD-10-CM

## 2017-09-06 LAB — POCT URINALYSIS DIP (DEVICE)
Bilirubin Urine: NEGATIVE
Glucose, UA: NEGATIVE mg/dL
HGB URINE DIPSTICK: NEGATIVE
KETONES UR: NEGATIVE mg/dL
Leukocytes, UA: NEGATIVE
Nitrite: NEGATIVE
PROTEIN: NEGATIVE mg/dL
SPECIFIC GRAVITY, URINE: 1.025 (ref 1.005–1.030)
UROBILINOGEN UA: 0.2 mg/dL (ref 0.0–1.0)
pH: 6.5 (ref 5.0–8.0)

## 2017-09-06 NOTE — Patient Instructions (Signed)

## 2017-09-06 NOTE — Progress Notes (Signed)
  Subjective:    Jeanette [name] is being seen today for her first obstetrical visit.  This is not a planned pregnancy. She is at 5549w4d gestation. Her obstetrical history is significant for nothing. . Relationship with FOB: significant other, living together. Patient wants to try to  intend to breast feed. Pregnancy history fully reviewed.  Patient reports no complaints. .  Review of Systems:   Review of Systems  HENT: Negative.   Respiratory: Negative.   Gastrointestinal: Negative.   Genitourinary: Negative.   Musculoskeletal: Negative.   Neurological: Negative.   Psychiatric/Behavioral: Negative.     Objective:     BP 137/67   Pulse 85   Wt 218 lb 12.8 oz (99.2 kg)   LMP 06/10/2017   BMI 38.76 kg/m  Physical Exam  Constitutional: She is oriented to person, place, and time. She appears well-developed and well-nourished.  HENT:  Head: Normocephalic.  Neck: Normal range of motion.  Respiratory: Effort normal.  GI: Soft.  Musculoskeletal: Normal range of motion.  Neurological: She is alert and oriented to person, place, and time.  Skin: Skin is warm and dry.  Psychiatric: She has a normal mood and affect.    Exam   FHR 160 by informal bedside US.   Assessment:    Pregnancy: G1P0000 Patient Active Problem List   Diagnosis Date Noted  . Abdominal pain affecting pregnancy 07/22/2017  . Pregnancy with uncertain fetal viability 07/22/2017       Plan:     Initial labs drawn. Prenatal vitamins. Problem list reviewed and updated. AFP3 discussed: 1st trimester screen. Role of ultrasound in pregnancy discussed; fetal survey: will order at next visit. Amniocentesis discussed: not indicated. Follow up in 4 weeks. 30% of 40 min visit spent on counseling and coordination of care.    Charlesetta GaribaldiKathryn Lorraine Kooistra CNM 09/06/2017

## 2017-09-07 LAB — GC/CHLAMYDIA PROBE AMP (~~LOC~~) NOT AT ARMC
Chlamydia: NEGATIVE
Neisseria Gonorrhea: NEGATIVE

## 2017-09-08 LAB — HEMOGLOBINOPATHY EVALUATION
HEMOGLOBIN A2 QUANTITATION: 2.3 % (ref 1.8–3.2)
HGB C: 0 %
HGB S: 0 %
HGB VARIANT: 0 %
Hemoglobin F Quantitation: 0 % (ref 0.0–2.0)
Hgb A: 97.7 % (ref 96.4–98.8)

## 2017-09-08 LAB — OBSTETRIC PANEL, INCLUDING HIV
Antibody Screen: NEGATIVE
BASOS ABS: 0 10*3/uL (ref 0.0–0.2)
Basos: 0 %
EOS (ABSOLUTE): 0.1 10*3/uL (ref 0.0–0.4)
EOS: 1 %
HEP B S AG: NEGATIVE
HIV SCREEN 4TH GENERATION: NONREACTIVE
Hematocrit: 34.8 % (ref 34.0–46.6)
Hemoglobin: 11.9 g/dL (ref 11.1–15.9)
Immature Grans (Abs): 0 10*3/uL (ref 0.0–0.1)
Immature Granulocytes: 0 %
LYMPHS ABS: 2.4 10*3/uL (ref 0.7–3.1)
Lymphs: 26 %
MCH: 30.2 pg (ref 26.6–33.0)
MCHC: 34.2 g/dL (ref 31.5–35.7)
MCV: 88 fL (ref 79–97)
Monocytes Absolute: 0.7 10*3/uL (ref 0.1–0.9)
Monocytes: 8 %
NEUTROS ABS: 6 10*3/uL (ref 1.4–7.0)
Neutrophils: 65 %
PLATELETS: 302 10*3/uL (ref 150–379)
RBC: 3.94 x10E6/uL (ref 3.77–5.28)
RDW: 15 % (ref 12.3–15.4)
RH TYPE: POSITIVE
RPR: NONREACTIVE
Rubella Antibodies, IGG: 2.53 index (ref >0.99)
WBC: 9.2 10*3/uL (ref 3.4–10.8)

## 2017-09-08 LAB — HEMOGLOBIN A1C
Est. average glucose Bld gHb Est-mCnc: 97 mg/dL
Hgb A1c MFr Bld: 5 % (ref 4.8–5.6)

## 2017-09-08 LAB — CULTURE, OB URINE

## 2017-09-08 LAB — URINE CULTURE, OB REFLEX

## 2017-09-13 ENCOUNTER — Encounter: Payer: Self-pay | Admitting: Family Medicine

## 2017-09-13 DIAGNOSIS — O99210 Obesity complicating pregnancy, unspecified trimester: Secondary | ICD-10-CM | POA: Insufficient documentation

## 2017-09-15 ENCOUNTER — Encounter (HOSPITAL_COMMUNITY): Payer: Self-pay

## 2017-09-15 ENCOUNTER — Ambulatory Visit (HOSPITAL_COMMUNITY)
Admission: RE | Admit: 2017-09-15 | Discharge: 2017-09-15 | Disposition: A | Discharge status: Home / Self Care | Payer: Medicaid Other | Source: Ambulatory Visit | Attending: Student | Admitting: Student

## 2017-09-15 ENCOUNTER — Other Ambulatory Visit: Payer: Self-pay | Admitting: Student

## 2017-09-15 DIAGNOSIS — Z3682 Encounter for antenatal screening for nuchal translucency: Secondary | ICD-10-CM

## 2017-09-15 DIAGNOSIS — R109 Unspecified abdominal pain: Secondary | ICD-10-CM

## 2017-09-15 DIAGNOSIS — O99210 Obesity complicating pregnancy, unspecified trimester: Secondary | ICD-10-CM

## 2017-09-15 DIAGNOSIS — Z3A12 12 weeks gestation of pregnancy: Secondary | ICD-10-CM | POA: Insufficient documentation

## 2017-09-15 DIAGNOSIS — Z3400 Encounter for supervision of normal first pregnancy, unspecified trimester: Secondary | ICD-10-CM

## 2017-09-15 DIAGNOSIS — O26899 Other specified pregnancy related conditions, unspecified trimester: Secondary | ICD-10-CM

## 2017-09-15 IMAGING — US US MFM FETAL NUCHAL TRANSLUCENCY
1 series · 15 of 28 positions shown · non-contrast
Comparison: none

[Series 1: us mfm fetal nuchal translucency · 15 of 46 slices shown]
[im 1/46]
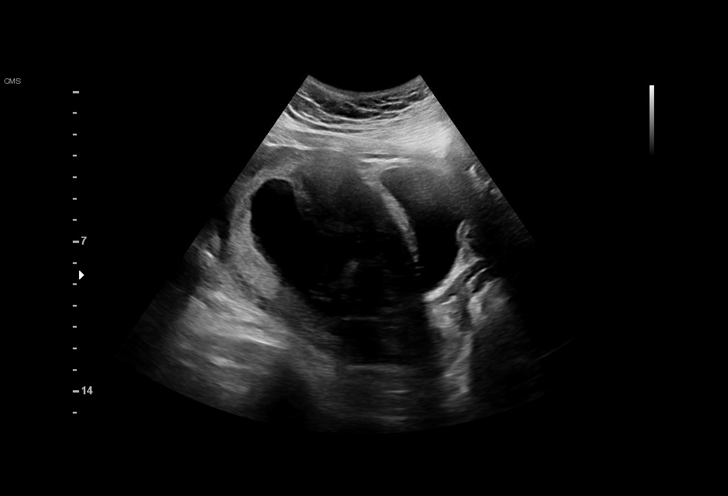
[im 4/46]
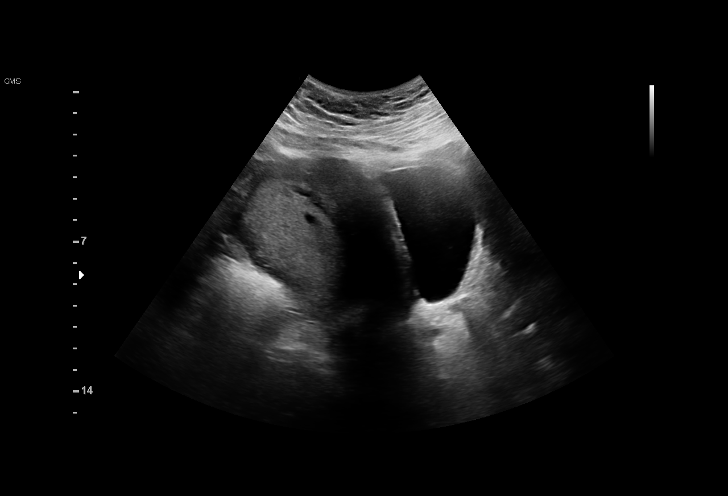
[im 7/46]
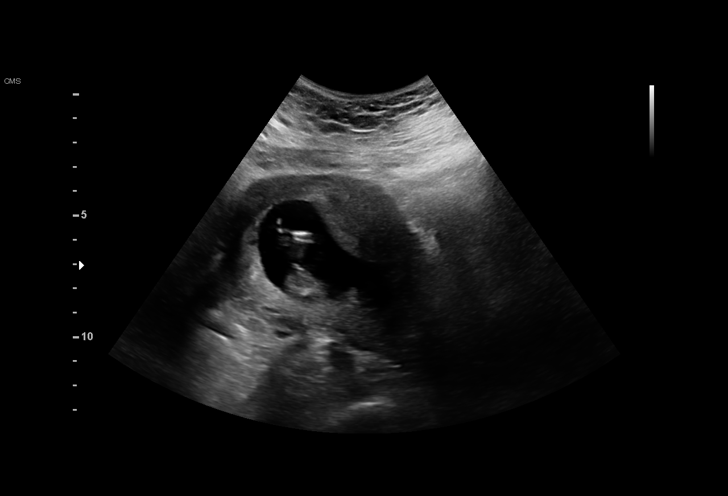
[im 11/46]
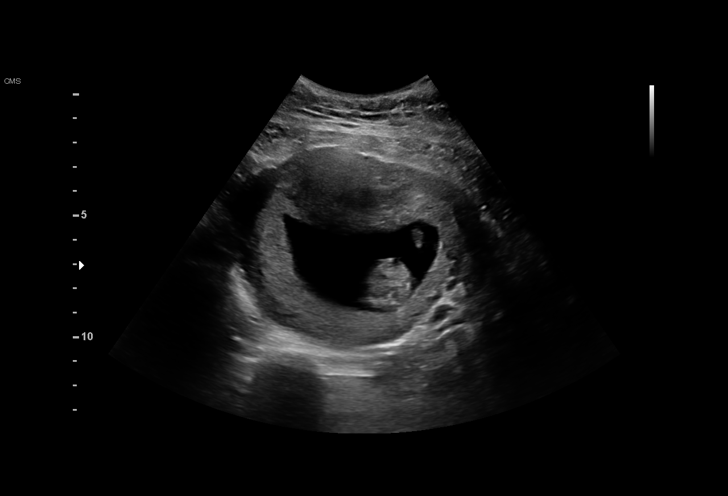
[im 14/46]
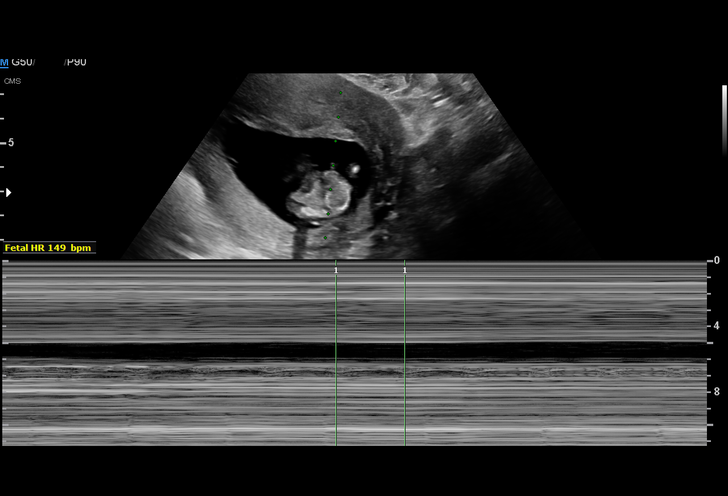
[im 17/46]
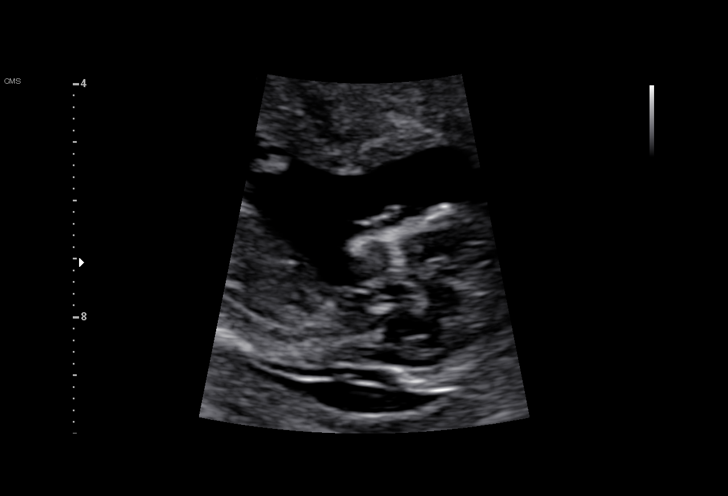
[im 21/46]
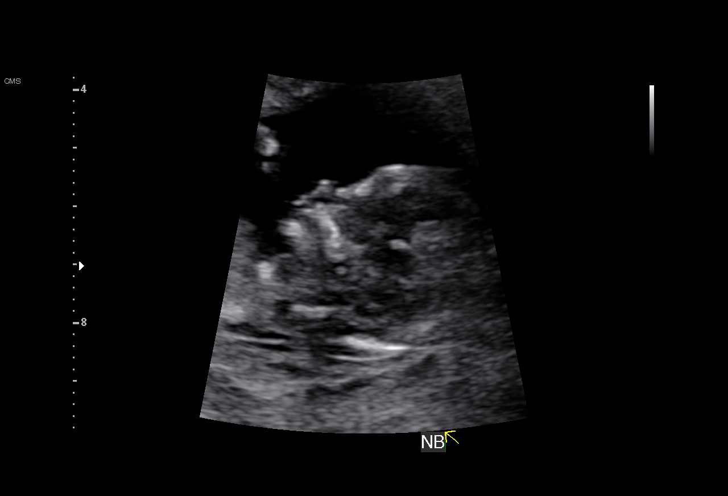
[im 24/46]
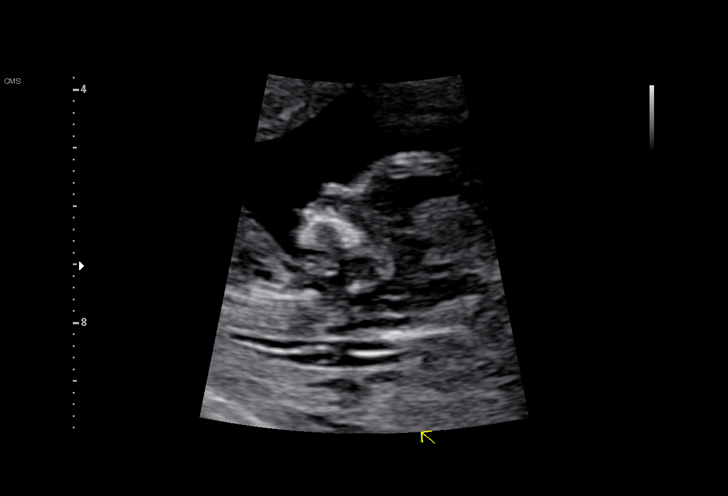
[im 26/46]
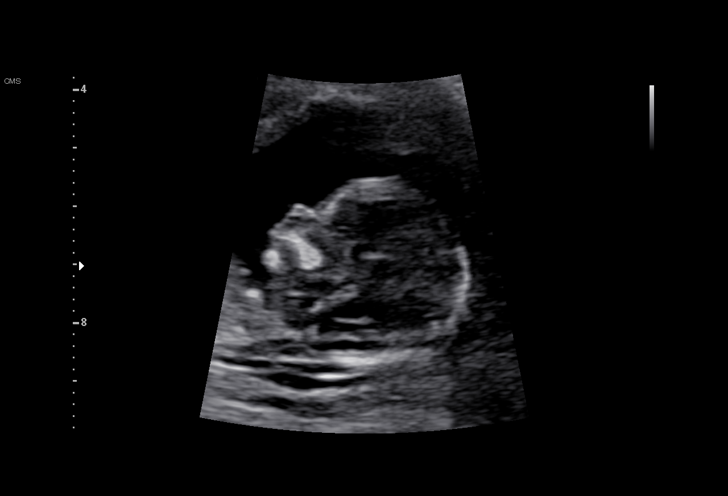
[im 29/46]
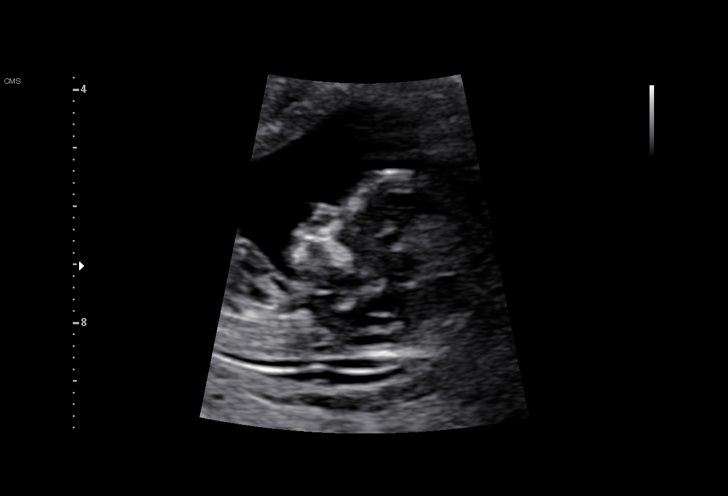
[im 32/46]
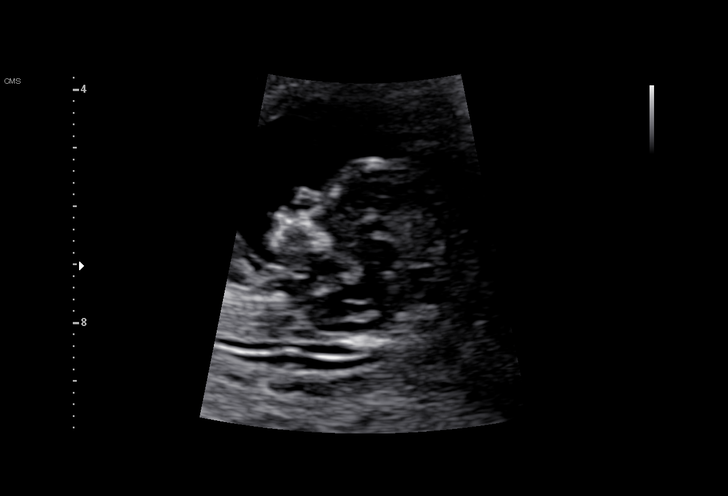
[im 36/46]
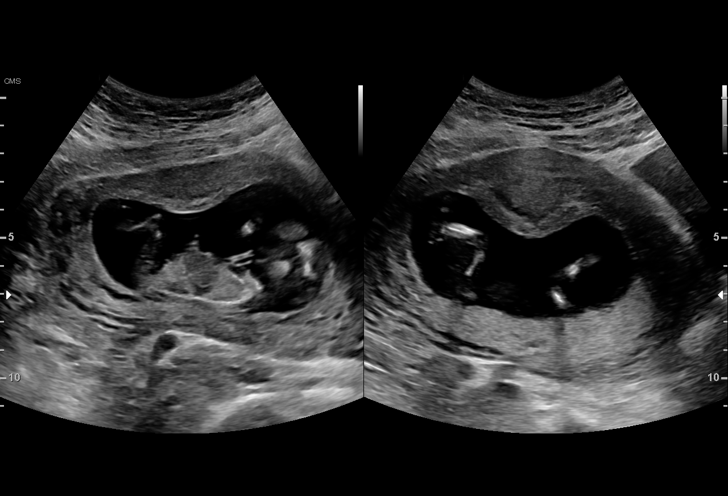
[im 39/46]
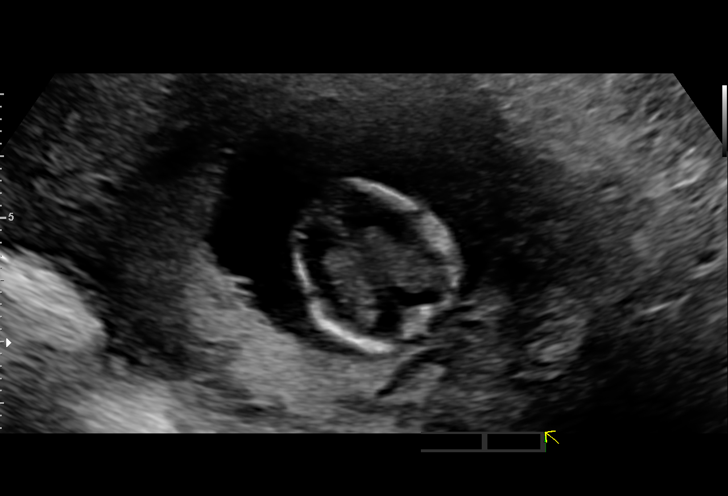
[im 42/46]
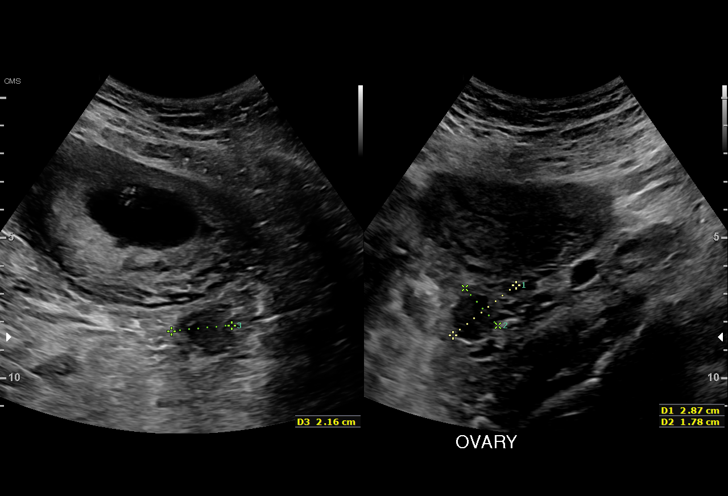
[im 46/46]
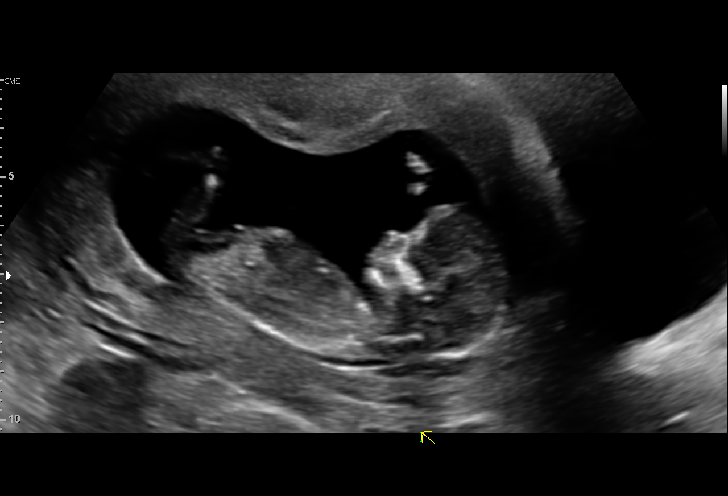

[15 of 28 positions shown; findings below may reference images not displayed]

[facility] Clinic

TRANSLUCENCY

1  SCARLET         [PHONE_NUMBER]      [PHONE_NUMBER]     [PHONE_NUMBER]
Indications

12 weeks gestation of pregnancy
Encounter for nuchal translucency              [23]
OB History

Gravidity:    1         Term:   0        Prem:   0        SAB:   0
TOP:          0       Ectopic:  0        Living: 0
Fetal Evaluation

Num Of Fetuses:     1
Preg. Location:     Intrauterine
Gest. Sac:          Intrauterine
Fetal Pole:         Visualized
Fetal Heart         149
Rate(bpm):
Cardiac Activity:   Observed

Amniotic Fluid
AFI FV:      Subjectively within normal limits
Biometry

CRL:      79.8  mm     G. Age:  13w 5d                  EDD:   [DATE]
Gestational Age
LMP:           13w 6d        Date:  [DATE]                 EDD:   [DATE]
Best:          12w 6d     Det. By:  Early Ultrasound         EDD:   [DATE]
([DATE])
1st Trimester Genetic Sonogram Screening

CRL:            79.8  mm    G. Age:   13w 5d                 EDD:   [DATE]
Nuc Trans:       1.8  mm
Nasal Bone:                 Present
Cervix Uterus Adnexa

Cervix
Closed

Uterus
No abnormality visualized.

Left Ovary
Within normal limits.

Right Ovary
Within normal limits.

Cul De Sac:   No free fluid seen.

Adnexa:       No abnormality visualized.
Impression

Singleton intrauterine pregnancy at 12 weeks 6 days
gestation with fetal cardiac activity
NT 1.8 mm
NB present
Desires first trimester screen today
Recommendations

Recommend follow-up ultrasound examination at 
weeks gestation for fetal anatomic survey

## 2017-09-20 ENCOUNTER — Other Ambulatory Visit: Payer: Self-pay

## 2017-09-29 ENCOUNTER — Encounter: Payer: Self-pay | Admitting: Student

## 2017-09-29 ENCOUNTER — Encounter: Payer: Self-pay | Admitting: *Deleted

## 2017-10-02 ENCOUNTER — Ambulatory Visit (INDEPENDENT_AMBULATORY_CARE_PROVIDER_SITE_OTHER): Payer: Self-pay | Admitting: Family Medicine

## 2017-10-02 VITALS — BP 121/55 | HR 89 | Wt 220.5 lb

## 2017-10-02 DIAGNOSIS — O99212 Obesity complicating pregnancy, second trimester: Secondary | ICD-10-CM

## 2017-10-02 DIAGNOSIS — Z3400 Encounter for supervision of normal first pregnancy, unspecified trimester: Secondary | ICD-10-CM

## 2017-10-02 DIAGNOSIS — Z23 Encounter for immunization: Secondary | ICD-10-CM

## 2017-10-02 DIAGNOSIS — Z3402 Encounter for supervision of normal first pregnancy, second trimester: Secondary | ICD-10-CM

## 2017-10-02 DIAGNOSIS — E669 Obesity, unspecified: Secondary | ICD-10-CM

## 2017-10-02 DIAGNOSIS — O99210 Obesity complicating pregnancy, unspecified trimester: Secondary | ICD-10-CM

## 2017-10-02 NOTE — Progress Notes (Signed)
   PRENATAL VISIT NOTE  Subjective:  Jeanette [name] is a 21 y.o. G1P0000 at 5720w2d being seen today for ongoing prenatal care.  She is currently monitored for the following issues for this low-risk pregnancy and has Abdominal pain affecting pregnancy; Supervision of normal first pregnancy, antepartum; and Obesity affecting pregnancy, antepartum on their problem list.  Patient reports no complaints.  Contractions: Not present. Vag. Bleeding: None.  Movement: Absent. Denies leaking of fluid.   The following portions of the patient's history were reviewed and updated as appropriate: allergies, current medications, past family history, past medical history, past social history, past surgical history and problem list. Problem list updated.  Objective:   Vitals:   10/02/17 1508  BP: (!) 121/55  Pulse: 89  Weight: 220 lb 8 oz (100 kg)    Fetal Status: Fetal Heart Rate (bpm): 148   Movement: Absent     General:  Alert, oriented and cooperative. Patient is in no acute distress.  Skin: Skin is warm and dry. No rash noted.   Cardiovascular: Normal heart rate noted  Respiratory: Normal respiratory effort, no problems with respiration noted  Abdomen: Soft, gravid, appropriate for gestational age.  Pain/Pressure: Absent     Pelvic: Cervical exam deferred        Extremities: Normal range of motion.  Edema: None  Mental Status:  Normal mood and affect. Normal behavior. Normal judgment and thought content.   Assessment and Plan:  Pregnancy: G1P0000 at 6020w2d  1. Supervision of normal first pregnancy, antepartum FHT and FH normal - Flu Vaccine QUAD 36+ mos IM  2. Obesity affecting pregnancy, antepartum  - Flu Vaccine QUAD 36+ mos IM  Preterm labor symptoms and general obstetric precautions including but not limited to vaginal bleeding, contractions, leaking of fluid and fetal movement were reviewed in detail with the patient. Please refer to After Visit Summary for other counseling  recommendations.  No Follow-up on file.   Levie HeritageJacob J Stinson, DO

## 2017-10-26 ENCOUNTER — Encounter: Payer: Medicaid Other | Admitting: Student

## 2017-10-31 ENCOUNTER — Ambulatory Visit (HOSPITAL_COMMUNITY)
Admission: RE | Admit: 2017-10-31 | Discharge: 2017-10-31 | Disposition: A | Discharge status: Home / Self Care | Payer: Medicaid Other | Source: Ambulatory Visit | Attending: Family Medicine | Admitting: Family Medicine

## 2017-10-31 DIAGNOSIS — O99213 Obesity complicating pregnancy, third trimester: Secondary | ICD-10-CM | POA: Diagnosis not present

## 2017-10-31 DIAGNOSIS — Z3A19 19 weeks gestation of pregnancy: Secondary | ICD-10-CM | POA: Insufficient documentation

## 2017-10-31 DIAGNOSIS — Z362 Encounter for other antenatal screening follow-up: Secondary | ICD-10-CM | POA: Insufficient documentation

## 2017-10-31 DIAGNOSIS — O99210 Obesity complicating pregnancy, unspecified trimester: Secondary | ICD-10-CM

## 2017-10-31 DIAGNOSIS — Z3400 Encounter for supervision of normal first pregnancy, unspecified trimester: Secondary | ICD-10-CM

## 2017-11-01 ENCOUNTER — Encounter: Payer: Medicaid Other | Admitting: Medical

## 2017-11-07 ENCOUNTER — Encounter: Payer: Medicaid Other | Admitting: Advanced Practice Midwife

## 2017-11-21 NOTE — L&D Delivery Note (Signed)
Patient is a 22 y.o. now G1P1 s/p NSVD at 2941w1d, who was admitted for PROM @0545  on 03/10/18.  She progressed with augmentation (Cytotec, FB, Pitocin) to complete and pushed 30 minutes to deliver. Shoulder dystocia: McRoberts, suprapubic pressure and delivery of posterior arm reduced to deliver. Cord clamping delayed by one minute then clamped by CNM and cut by FOB.  Placenta intact and spontaneous, bleeding minimal.   2nd degree perineal laceration repaired without difficulty.  Mom and baby stable prior to transfer to postpartum. She plans on breastfeeding. She is unsure for birth control.  Delivery Note At 4:27 AM a viable female was delivered via Vaginal, Spontaneous (Presentation: LOA).  APGAR: 5, 9; weight pending .   Placenta intact and spontaneous, bleeding minimal. 3VCord:  with the following complications: Shoulder Dystocia.  Cord pH: 7.15 CO2 50  Anesthesia: Epidural, Local lidocaine for repair   Episiotomy: None Lacerations: 2nd degree;Perineal Suture Repair: 2.0 vicryl Est. Blood Loss (mL): 250  Mom to postpartum.  Baby to Couplet care / Skin to Skin.  Sharyon CableVeronica C Delayna Sparlin CNM 03/11/2018, 5:18 AM

## 2017-11-22 ENCOUNTER — Other Ambulatory Visit: Payer: Self-pay | Admitting: General Practice

## 2017-11-22 ENCOUNTER — Encounter: Payer: Self-pay | Admitting: Student

## 2017-11-22 DIAGNOSIS — O99210 Obesity complicating pregnancy, unspecified trimester: Secondary | ICD-10-CM

## 2017-11-22 DIAGNOSIS — Z3400 Encounter for supervision of normal first pregnancy, unspecified trimester: Secondary | ICD-10-CM

## 2017-11-28 ENCOUNTER — Encounter: Payer: Medicaid Other | Admitting: Medical

## 2017-11-28 ENCOUNTER — Ambulatory Visit (HOSPITAL_COMMUNITY)
Admission: RE | Admit: 2017-11-28 | Discharge: 2017-11-28 | Disposition: A | Discharge status: Home / Self Care | Payer: Medicaid Other | Source: Ambulatory Visit | Attending: Student | Admitting: Student

## 2017-11-28 DIAGNOSIS — O99210 Obesity complicating pregnancy, unspecified trimester: Secondary | ICD-10-CM

## 2017-11-28 DIAGNOSIS — Z362 Encounter for other antenatal screening follow-up: Secondary | ICD-10-CM | POA: Insufficient documentation

## 2017-11-28 DIAGNOSIS — Z3A23 23 weeks gestation of pregnancy: Secondary | ICD-10-CM | POA: Insufficient documentation

## 2017-11-28 DIAGNOSIS — O99212 Obesity complicating pregnancy, second trimester: Secondary | ICD-10-CM | POA: Diagnosis not present

## 2017-11-28 DIAGNOSIS — Z3400 Encounter for supervision of normal first pregnancy, unspecified trimester: Secondary | ICD-10-CM

## 2017-12-04 ENCOUNTER — Encounter: Payer: Self-pay | Admitting: Student

## 2017-12-14 ENCOUNTER — Encounter: Payer: Self-pay | Admitting: Family Medicine

## 2017-12-14 ENCOUNTER — Encounter: Payer: Medicaid Other | Admitting: Student

## 2017-12-14 NOTE — Progress Notes (Unsigned)
Patient sent a message in the MyChart about her appointment. She has missed several appointments, and needs to get a 2 hr gtt. However, patient has stated she can only come in the late afternoon. I have replayed to patient to give me a time and day that works best for her.

## 2017-12-19 ENCOUNTER — Ambulatory Visit (INDEPENDENT_AMBULATORY_CARE_PROVIDER_SITE_OTHER): Payer: Medicaid Other | Admitting: Advanced Practice Midwife

## 2017-12-19 ENCOUNTER — Encounter: Payer: Self-pay | Admitting: Student

## 2017-12-19 VITALS — BP 131/69 | HR 93 | Wt 238.6 lb

## 2017-12-19 DIAGNOSIS — Z3400 Encounter for supervision of normal first pregnancy, unspecified trimester: Secondary | ICD-10-CM

## 2017-12-19 DIAGNOSIS — Z3402 Encounter for supervision of normal first pregnancy, second trimester: Secondary | ICD-10-CM

## 2017-12-19 DIAGNOSIS — Z23 Encounter for immunization: Secondary | ICD-10-CM

## 2017-12-19 NOTE — Progress Notes (Signed)
   PRENATAL VISIT NOTE  Subjective:  Jeanette [name] is a 22 y.o. G1P0000 at 6168w3d being seen today for ongoing prenatal care.  She is currently monitored for the following issues for this low-risk pregnancy and has Abdominal pain affecting pregnancy; Supervision of normal first pregnancy, antepartum; and Obesity affecting pregnancy, antepartum on their problem list.  Patient reports no complaints.  Contractions: Not present. Vag. Bleeding: None.  Movement: Present. Denies leaking of fluid.   The following portions of the patient's history were reviewed and updated as appropriate: allergies, current medications, past family history, past medical history, past social history, past surgical history and problem list. Problem list updated.  Objective:   Vitals:   12/19/17 1521  BP: 131/69  Pulse: 93  Weight: 238 lb 9.6 oz (108.2 kg)    Fetal Status: Fetal Heart Rate (bpm): 164 Fundal Height: 27 cm Movement: Present     General:  Alert, oriented and cooperative. Patient is in no acute distress.  Skin: Skin is warm and dry. No rash noted.   Cardiovascular: Normal heart rate noted  Respiratory: Normal respiratory effort, no problems with respiration noted  Abdomen: Soft, gravid, appropriate for gestational age.  Pain/Pressure: Absent     Pelvic: Cervical exam deferred        Extremities: Normal range of motion.  Edema: None  Mental Status:  Normal mood and affect. Normal behavior. Normal judgment and thought content.   Assessment and Plan:  Pregnancy: G1P0000 at 7968w3d  1. Supervision of normal first pregnancy, antepartum - 2 hour GTT at next visit   Preterm labor symptoms and general obstetric precautions including but not limited to vaginal bleeding, contractions, leaking of fluid and fetal movement were reviewed in detail with the patient. Please refer to After Visit Summary for other counseling recommendations.  Return in about 2 weeks (around 01/02/2018).   Thressa ShellerHeather Hogan,  CNM

## 2017-12-19 NOTE — Progress Notes (Signed)
Tdap accepted,right arm @3 :30p

## 2017-12-19 NOTE — Progress Notes (Signed)
   PRENATAL VISIT NOTE  Subjective:  Jeanette [name] is a 22 y.o. G1P0000 at 7416w3d being seen today for ongoing prenatal care.  She is currently monitored for the following issues for this low-risk pregnancy and has Abdominal pain affecting pregnancy; Supervision of normal first pregnancy, antepartum; and Obesity affecting pregnancy, antepartum on their problem list.  Patient reports no complaints.  Contractions: Not present. Vag. Bleeding: None.  Movement: Present. Denies leaking of fluid.   The following portions of the patient's history were reviewed and updated as appropriate: allergies, current medications, past family history, past medical history, past social history, past surgical history and problem list. Problem list updated.  Objective:   Vitals:   12/19/17 1521  BP: 131/69  Pulse: 93  Weight: 238 lb 9.6 oz (108.2 kg)    Fetal Status: Fetal Heart Rate (bpm): 164   Movement: Present     General:  Alert, oriented and cooperative. Patient is in no acute distress.  Skin: Skin is warm and dry. No rash noted.   Cardiovascular: Normal heart rate noted  Respiratory: Normal respiratory effort, no problems with respiration noted  Abdomen: Soft, gravid, appropriate for gestational age.  Pain/Pressure: Absent     Pelvic: Cervical exam deferred        Extremities: Normal range of motion.  Edema: None  Mental Status:  Normal mood and affect. Normal behavior. Normal judgment and thought content.   Assessment and Plan:  Pregnancy: G1P0000 at 8016w3d  1. Supervision of normal first pregnancy, antepartum - 2Hr GGT; future due to not fasting today - Tdap vaccine given  Preterm labor symptoms and general obstetric precautions including but not limited to vaginal bleeding, contractions, leaking of fluid and fetal movement were reviewed in detail with the patient. Please refer to After Visit Summary for other counseling recommendations.  Return in about 2 weeks (around 01/02/2018).  -  Needs 2hr GGT scheduled prior to next appointment.  Jeanette [name], Student-PA

## 2017-12-19 NOTE — Patient Instructions (Signed)
AREA PEDIATRIC/FAMILY PRACTICE PHYSICIANS  [facility] [address], [address] [place], [place]  [address] Phone - [phone]   Fax - [phone]  [facility] [address] [place], [place] [address] Phone - [phone]   Fax - [phone]  [facility] [address] [place], [place]  [address] Phone - [phone]   Fax - [phone]  [facility] [address] [place], [place]  [address] Phone - [phone]   Fax - [phone]  [facility] [address] [place], [place]  [address] Phone - [phone]   Fax - [phone]  [facility] [address] [place], [place]  [address] Phone - [phone]   Fax - [phone]  [facility] [address] [place], [place]  [address] Phone - [phone]   Fax - [phone]  [facility] [address] [place], [place]  [address] Phone - [phone]   Fax - [phone]  [facility] [address] [place], [place]  [address] Phone - [phone]   Fax - [phone] [facility] [address] [place], [place]  [address] Phone - [phone]   Fax - [phone]  [facility] [address] [place], [place]  [address] Phone - [phone]   Fax - [phone]  [facility] [address] [place], [place]  [address] Phone - [phone]   Fax - [phone]  [facility] [address] [place], [place]  [address] Phone - [phone]   Fax - [phone]  [facility] [address] [place], [place]  [address] Phone - [phone]  [facility] [address] [place], [place]  [address] Phone - [phone]   Fax - [phone]  [facility] [address] [place], [place]  [address] Phone - [phone]   Fax - [phone]  [facility] [address] [place], [place]  [address] Phone - [phone]   Fax - [phone]  [facility] [address] [place], [place]  [address] Phone - [phone]   Fax - [phone]   [facility] [address] [place], [place]  [address] Phone - [phone]   Fax - [phone]  [facility] [address] [place], [place]  [address] Phone - [phone]   Fax - [phone]  [facility] [address] [place], [place]  [address] Phone - [phone]   Fax - [phone]  [facility] [address] [place], [place]  [address] Phone - [phone]   Fax - [phone]  [facility] [address] , [place]  [address] Phone - [phone]   Fax - [phone] [facility] [address] [place], [place]  [address] Phone - [phone]   Fax - [phone]  [facility] [address] [place],   [address] Phone - [phone]   Fax - [phone]  [facility] [address] [place],   [address] Phone - [phone]   Fax - [phone]  [facility] [place] Charlene Flemming MD [address] [place]  [address] Phone [phone]  Fax [phone]  Childbirth Education Options: [facility] Classes:  Childbirth education classes can help you   get ready for a positive parenting experience. You can also meet other expectant parents and get free stuff for your baby. Each class runs for five weeks on the same night and costs $45 for the mother-to-be and her support person. Medicaid covers the cost if you are eligible. Call [phone] to register. [facility] Childbirth Education:  [phone] or [phone] or  [email or web address]  Baby & Me Class: Discuss newborn & infant parenting and family adjustment issues with other new mothers in a relaxed environment. Each week brings a new speaker or baby-centered activity. We encourage new mothers to join us every Thursday at 11:00am. Babies birth until crawling. No registration or fee. Daddy Boot Camp: This course offers Dads-to-be the tools and knowledge needed to feel confident on their journey to becoming new fathers. Experienced dads, who have been trained as coaches, teach dads-to-be how to hold, comfort, diaper, swaddle and play with their infant while being able to support the new mom as well. A class for men taught by men. $25/dad Big Brother/Big Sister: Let your children share in the joy of a new brother or sister in this special class designed just for them. Class includes discussion about how families care for babies: swaddling, holding, diapering, safety as well as how they can be helpful in their new role. This class is designed for children ages 2 to 6, but any age is welcome. Please register each child individually. $5/child  Mom Talk: This mom-led group offers support and connection to mothers as they journey through the adjustments and struggles of that sometimes overwhelming first year after the birth of a child. Tuesdays at 10:00am and Thursdays at 6:00pm. Babies welcome. No registration or fee. Breastfeeding Support Group: This group is a mother-to-mother support circle where moms have the opportunity to share their breastfeeding experiences. A Lactation Consultant is present for questions and concerns. Meets each Tuesday at 11:00am. No fee or registration. Breastfeeding Your Baby: Learn what to expect in the first days of breastfeeding your newborn.  This class will help you feel more confident with the skills needed to begin your breastfeeding experience. Many new mothers are concerned about breastfeeding after leaving the hospital. This class  will also address the most common fears and challenges about breastfeeding during the first few weeks, months and beyond. (call for fee) Comfort Techniques and Tour: This 2 hour interactive class will provide you the opportunity to learn & practice hands-on techniques that can help relieve some of the discomfort of labor and encourage your baby to rotate toward the best position for birth. You and your partner will be able to try a variety of labor positions with birth balls and rebozos as well as practice breathing, relaxation, and visualization techniques. A tour of the [facility] is included with this class. $20 per registrant and support person Childbirth Class- Weekend Option: This class is a Weekend version of our Birth & Baby series. It is designed for parents who have a difficult time fitting several weeks of classes into their schedule. It covers the care of your newborn and the basics of labor and childbirth. It also includes a Maternity Care Center Tour of [facility] and lunch. The class is held two consecutive days: beginning on Friday evening from 6:30 - 8:30 p.m. and the next day, Saturday from 9 a.m. - 4 p.m. (call for fee) Waterbirth Class: Interested in a waterbirth?  This informational class will help you discover whether waterbirth is the right fit for you.   Education about waterbirth itself, supplies you would need and how to assemble your support team is what you can expect from this class. Some obstetrical practices require this class in order to pursue a waterbirth. (Not all obstetrical practices offer waterbirth-check with your healthcare provider.) Register only the expectant mom, but you are encouraged to bring your partner to class! Required if planning waterbirth, no fee. Infant/Child CPR: Parents, grandparents, babysitters, and friends learn Cardio-Pulmonary Resuscitation skills for infants and children. You will also learn how to treat both conscious  and unconscious choking in infants and children. This Family & Friends program does not offer certification. Register each participant individually to ensure that enough mannequins are available. (Call for fee) Grandparent Love: Expecting a grandbaby? This class is for you! Learn about the latest infant care and safety recommendations and ways to support your own child as he or she transitions into the parenting role. Taught by Registered Nurses who are childbirth instructors, but most importantly...they are grandmothers too! $10/person. Childbirth Class- Natural Childbirth: This series of 5 weekly classes is for expectant parents who want to learn and practice natural methods of coping with the process of labor and childbirth. Relaxation, breathing, massage, visualization, role of the partner, and helpful positioning are highlighted. Participants learn how to be confident in their body's ability to give birth. This class will empower and help parents make informed decisions about their own care. Includes discussion that will help new parents transition into the immediate postpartum period. [facility] is included. We suggest taking this class between 25-32 weeks, but it's only a recommendation. $75 per registrant and one support person or $30 Medicaid. Childbirth Class- 3 week Series: This option of 3 weekly classes helps you and your labor partner prepare for childbirth. Newborn care, labor & birth, cesarean birth, pain management, and comfort techniques are discussed and a [place] [facility] is included. The class meets at the same time, on the same day of the week for 3 consecutive weeks beginning with the starting date you choose. $60 for registrant and one support person.  Marvelous Multiples: Expecting twins, triplets, or more? This class covers the differences in labor, birth, parenting, and breastfeeding issues that face multiples' parents.  NICU tour is included. Led by a Certified Childbirth Educator who is the mother of twins. No fee. Caring for Baby: This class is for expectant and adoptive parents who want to learn and practice the most up-to-date newborn care for their babies. Focus is on birth through the first six weeks of life. Topics include feeding, bathing, diapering, crying, umbilical cord care, circumcision care and safe sleep. Parents learn to recognize symptoms of illness and when to call the pediatrician. Register only the mom-to-be and your partner or support person can plan to come with you! $10 per registrant and support person Childbirth Class- online option: This online class offers you the freedom to complete a Birth and Baby series in the comfort of your own home. The flexibility of this option allows you to review sections at your own pace, at times convenient to you and your support people. It includes additional video information, animations, quizzes, and extended activities. Get organized with helpful eClass tools, checklists, and trackers. Once you register online for the class, you will receive an email within a few days to accept the invitation and begin the class when the time is right for you. The content will be available to you for 60 days. $  60 for 60 days of online access for you and your support people.  Local Doulas: Natural Baby Doulas [email or web address] Tel: [phone] [email or web address] [facility] [phone] [email or web address] The Labor Ladies  (also do waterbirth tub rental) [phone] [email or web address] [email or web address] [phone] [email or web address] [employer]hyth[employer]  [phone] [email or web address] [place] [facility] (PADA) [email or web address] http://www.padanc.org/index.htm [place] [facility]   [email or web address] Considering Waterbirth? Guide for patients at [facility]  Why consider waterbirth?  . Gentle birth for babies . Less pain medicine used in labor . May allow for passive descent/less pushing . May reduce perineal tears  . More mobility and instinctive maternal position changes . Increased maternal relaxation . Reduced blood pressure in labor  Is waterbirth safe? What are the risks of infection, drowning or other complications?  . Infection: o Very low risk (3.7 % for tub vs 4.8% for bed) o 7 in 8000 waterbirths with documented infection o Poorly cleaned equipment most common cause o Slightly lower group B strep transmission rate  . Drowning o Maternal:  - Very low risk   - Related to seizures or fainting o Newborn:  - Very low risk. No evidence of increased risk of respiratory problems in multiple large studies - Physiological protection from breathing under water - Avoid underwater birth if there are any fetal complications - Once baby's head is out of the water, keep it out.  . Birth complication o Some reports of cord trauma, but risk decreased by bringing baby to surface gradually o No evidence of increased risk of shoulder dystocia. Mothers can usually change positions faster in water than in a bed, possibly aiding the maneuvers to free the shoulder.   You must attend a Waterbirth class at [facility]  3rd Wednesday of every month from 7-9pm  Free  Register by calling [phone] or online at [email or web address]  Bring us the certificate from the class to your prenatal appointment  Meet with a midwife at 36 weeks to see if you can still plan a waterbirth and to sign the consent.   Purchase or rent the following supplies:   Water Birth Pool (Birth Pool in a Box or [place] for instance)  (Tubs start ~$125)  Single-use disposable tub liner designed for your brand of tub  New garden hose labeled  "lead-free", "suitable for drinking water",  Electric drain pump to remove water (We recommend 792 gallon per hour or greater pump.)   Separate garden hose to remove the dirty water  Fish net  Bathing suit top (optional)  Long-handled mirror (optional)  Places to purchase or rent supplies  [email or web address] for tub purchases and supplies  Waterbirthsolutions.com for tub purchases and supplies  The Labor Ladies (www.[email or web address]) $275 for tub rental/set-up & take down/kit   [facility] (http://www.padanc.org/MeetUs.htm) Information regarding doulas (labor support) who provide pool rentals  Our practice has a Birth Pool in a Box tub at the hospital that you may borrow on a first-come-first-served basis. It is your responsibility to to set up, clean and break down the tub. We cannot guarantee the availability of this tub in advance. You are responsible for bringing all accessories listed above. If you do not have all necessary supplies you cannot have a waterbirth.    Things that would prevent you from having a waterbirth:  Premature, <37wks  Previous cesarean birth  Presence of thick meconium-stained fluid  Multiple gestation (Twins, triplets, etc.)  Uncontrolled diabetes or gestational diabetes requiring medication  Hypertension requiring medication   or diagnosis of pre-eclampsia  Heavy vaginal bleeding  Non-reassuring fetal heart rate  Active infection (MRSA, etc.). Group B Strep is NOT a contraindication for  waterbirth.  If your labor has to be induced and induction method requires continuous  monitoring of the baby's heart rate  Other risks/issues identified by your obstetrical provider  Please remember that birth is unpredictable. Under certain unforeseeable circumstances your provider may advise against giving birth in the tub. These decisions will be made on a case-by-case basis and with the safety of you and your baby as our highest  priority.  Safe Medications in Pregnancy   Acne: Benzoyl Peroxide Salicylic Acid  Backache/Headache: Tylenol: 2 regular strength every 4 hours OR              2 Extra strength every 6 hours  Colds/Coughs/Allergies: Benadryl (alcohol free) 25 mg every 6 hours as needed Breath right strips Claritin Cepacol throat lozenges Chloraseptic throat spray Cold-Eeze- up to three times per day Cough drops, alcohol free Flonase (by prescription only) Guaifenesin Mucinex Robitussin DM (plain only, alcohol free) Saline nasal spray/drops Sudafed (pseudoephedrine) & Actifed ** use only after [redacted] weeks gestation and if you do not have high blood pressure Tylenol Vicks Vaporub Zinc lozenges Zyrtec   Constipation: Colace Ducolax suppositories Fleet enema Glycerin suppositories Metamucil Milk of magnesia Miralax Senokot Smooth move tea  Diarrhea: Kaopectate Imodium A-D  *NO pepto Bismol  Hemorrhoids: Anusol Anusol HC Preparation H Tucks  Indigestion: Tums Maalox Mylanta Zantac  Pepcid  Insomnia: Benadryl (alcohol free) 25mg every 6 hours as needed Tylenol PM Unisom, no Gelcaps  Leg Cramps: Tums MagGel  Nausea/Vomiting:  Bonine Dramamine Emetrol Ginger extract Sea bands Meclizine  Nausea medication to take during pregnancy:  Unisom (doxylamine succinate 25 mg tablets) Take one tablet daily at bedtime. If symptoms are not adequately controlled, the dose can be increased to a maximum recommended dose of two tablets daily (1/2 tablet in the morning, 1/2 tablet mid-afternoon and one at bedtime). Vitamin B6 100mg tablets. Take one tablet twice a day (up to 200 mg per day).  Skin Rashes: Aveeno products Benadryl cream or 25mg every 6 hours as needed Calamine Lotion 1% cortisone cream  Yeast infection: Gyne-lotrimin 7 Monistat 7   **If taking multiple medications, please check labels to avoid duplicating the same active ingredients **take medication as  directed on the label ** Do not exceed 4000 mg of tylenol in 24 hours **Do not take medications that contain aspirin or ibuprofen        

## 2017-12-20 ENCOUNTER — Other Ambulatory Visit: Payer: Self-pay | Admitting: General Practice

## 2017-12-20 DIAGNOSIS — Z3400 Encounter for supervision of normal first pregnancy, unspecified trimester: Secondary | ICD-10-CM

## 2017-12-25 ENCOUNTER — Other Ambulatory Visit: Payer: Medicaid Other

## 2017-12-25 DIAGNOSIS — Z3400 Encounter for supervision of normal first pregnancy, unspecified trimester: Secondary | ICD-10-CM

## 2017-12-26 LAB — GLUCOSE TOLERANCE, 2 HOURS W/ 1HR
GLUCOSE, 1 HOUR: 97 mg/dL (ref 65–179)
GLUCOSE, FASTING: 69 mg/dL (ref 65–91)
Glucose, 2 hour: 115 mg/dL (ref 65–152)

## 2018-01-01 ENCOUNTER — Encounter: Payer: Self-pay | Admitting: Student

## 2018-01-02 ENCOUNTER — Encounter: Payer: Self-pay | Admitting: General Practice

## 2018-01-08 ENCOUNTER — Encounter: Payer: Self-pay | Admitting: Student

## 2018-01-12 ENCOUNTER — Encounter: Payer: Medicaid Other | Admitting: Certified Nurse Midwife

## 2018-01-15 ENCOUNTER — Encounter: Payer: Medicaid Other | Admitting: Advanced Practice Midwife

## 2018-01-29 ENCOUNTER — Ambulatory Visit (INDEPENDENT_AMBULATORY_CARE_PROVIDER_SITE_OTHER): Payer: Medicaid Other | Admitting: Obstetrics & Gynecology

## 2018-01-29 VITALS — BP 120/60 | HR 93 | Wt 248.6 lb

## 2018-01-29 DIAGNOSIS — Z3400 Encounter for supervision of normal first pregnancy, unspecified trimester: Secondary | ICD-10-CM

## 2018-01-29 NOTE — Progress Notes (Signed)
   PRENATAL VISIT NOTE  Subjective:  Jeanette [name] is a 22 y.o. G1P0000 at 335w2d being seen today for ongoing prenatal care.  She is currently monitored for the following issues for this low-risk pregnancy and has Abdominal pain affecting pregnancy; Supervision of normal first pregnancy, antepartum; and Obesity affecting pregnancy, antepartum on their problem list.  Patient reports no complaints.  Contractions: Not present. Vag. Bleeding: None.  Movement: Present. Denies leaking of fluid.   The following portions of the patient's history were reviewed and updated as appropriate: allergies, current medications, past family history, past medical history, past social history, past surgical history and problem list. Problem list updated.  Objective:   Vitals:   01/29/18 1356  BP: 120/60  Pulse: 93  Weight: 248 lb 9.6 oz (112.8 kg)    Fetal Status: Fetal Heart Rate (bpm): 143   Movement: Present     General:  Alert, oriented and cooperative. Patient is in no acute distress.  Skin: Skin is warm and dry. No rash noted.   Cardiovascular: Normal heart rate noted  Respiratory: Normal respiratory effort, no problems with respiration noted  Abdomen: Soft, gravid, appropriate for gestational age.  Pain/Pressure: Present     Pelvic: Cervical exam deferred        Extremities: Normal range of motion.  Edema: None  Mental Status:  Normal mood and affect. Normal behavior. Normal judgment and thought content.   Assessment and Plan:  Pregnancy: G1P0000 at 695w2d  There are no diagnoses linked to this encounter. Preterm labor symptoms and general obstetric precautions including but not limited to vaginal bleeding, contractions, leaking of fluid and fetal movement were reviewed in detail with the patient. Please refer to After Visit Summary for other counseling recommendations.  Return in about 2 weeks (around 02/12/2018).   Scheryl DarterJames Rexanne Inocencio, MD

## 2018-01-29 NOTE — Patient Instructions (Signed)

## 2018-03-01 ENCOUNTER — Encounter: Payer: Medicaid Other | Admitting: Student

## 2018-03-10 ENCOUNTER — Inpatient Hospital Stay (HOSPITAL_COMMUNITY): Payer: Medicaid Other | Admitting: Anesthesiology

## 2018-03-10 ENCOUNTER — Other Ambulatory Visit: Payer: Self-pay

## 2018-03-10 ENCOUNTER — Inpatient Hospital Stay (HOSPITAL_COMMUNITY)
Admission: AD | Admit: 2018-03-10 | Discharge: 2018-03-13 | DRG: 807 | Disposition: A | Discharge status: Home / Self Care | Payer: Medicaid Other | Source: Ambulatory Visit | Attending: Family Medicine | Admitting: Family Medicine

## 2018-03-10 ENCOUNTER — Encounter (HOSPITAL_COMMUNITY): Payer: Self-pay | Admitting: *Deleted

## 2018-03-10 DIAGNOSIS — Z8759 Personal history of other complications of pregnancy, childbirth and the puerperium: Secondary | ICD-10-CM | POA: Diagnosis not present

## 2018-03-10 DIAGNOSIS — O26899 Other specified pregnancy related conditions, unspecified trimester: Secondary | ICD-10-CM

## 2018-03-10 DIAGNOSIS — O4292 Full-term premature rupture of membranes, unspecified as to length of time between rupture and onset of labor: Secondary | ICD-10-CM | POA: Diagnosis present

## 2018-03-10 DIAGNOSIS — O134 Gestational [pregnancy-induced] hypertension without significant proteinuria, complicating childbirth: Secondary | ICD-10-CM | POA: Diagnosis present

## 2018-03-10 DIAGNOSIS — O4290 Premature rupture of membranes, unspecified as to length of time between rupture and onset of labor, unspecified weeks of gestation: Secondary | ICD-10-CM | POA: Diagnosis present

## 2018-03-10 DIAGNOSIS — O0990 Supervision of high risk pregnancy, unspecified, unspecified trimester: Secondary | ICD-10-CM

## 2018-03-10 DIAGNOSIS — Z3A38 38 weeks gestation of pregnancy: Secondary | ICD-10-CM | POA: Diagnosis not present

## 2018-03-10 DIAGNOSIS — O99214 Obesity complicating childbirth: Secondary | ICD-10-CM | POA: Diagnosis present

## 2018-03-10 DIAGNOSIS — Z87891 Personal history of nicotine dependence: Secondary | ICD-10-CM | POA: Diagnosis not present

## 2018-03-10 DIAGNOSIS — O99210 Obesity complicating pregnancy, unspecified trimester: Secondary | ICD-10-CM | POA: Diagnosis present

## 2018-03-10 DIAGNOSIS — R109 Unspecified abdominal pain: Secondary | ICD-10-CM

## 2018-03-10 HISTORY — DX: Obesity, unspecified: E66.9

## 2018-03-10 LAB — COMPREHENSIVE METABOLIC PANEL
ALT: 10 U/L — ABNORMAL LOW (ref 14–54)
AST: 19 U/L (ref 15–41)
Albumin: 3 g/dL — ABNORMAL LOW (ref 3.5–5.0)
Alkaline Phosphatase: 166 U/L — ABNORMAL HIGH (ref 38–126)
Anion gap: 10 (ref 5–15)
BUN: 7 mg/dL (ref 6–20)
CHLORIDE: 108 mmol/L (ref 101–111)
CO2: 17 mmol/L — ABNORMAL LOW (ref 22–32)
Calcium: 8.9 mg/dL (ref 8.9–10.3)
Creatinine, Ser: 0.52 mg/dL (ref 0.44–1.00)
Glucose, Bld: 74 mg/dL (ref 65–99)
POTASSIUM: 3.6 mmol/L (ref 3.5–5.1)
Sodium: 135 mmol/L (ref 135–145)
TOTAL PROTEIN: 7.2 g/dL (ref 6.5–8.1)
Total Bilirubin: 0.7 mg/dL (ref 0.3–1.2)

## 2018-03-10 LAB — CBC
HCT: 34.2 % — ABNORMAL LOW (ref 36.0–46.0)
Hemoglobin: 11.3 g/dL — ABNORMAL LOW (ref 12.0–15.0)
MCH: 29.2 pg (ref 26.0–34.0)
MCHC: 33 g/dL (ref 30.0–36.0)
MCV: 88.4 fL (ref 78.0–100.0)
Platelets: 323 10*3/uL (ref 150–400)
RBC: 3.87 MIL/uL (ref 3.87–5.11)
RDW: 13.4 % (ref 11.5–15.5)
WBC: 10.2 10*3/uL (ref 4.0–10.5)

## 2018-03-10 LAB — TYPE AND SCREEN
ABO/RH(D): A POS
Antibody Screen: NEGATIVE

## 2018-03-10 LAB — PROTEIN / CREATININE RATIO, URINE
CREATININE, URINE: 74 mg/dL
Protein Creatinine Ratio: 0.15 mg/mg{Cre} (ref 0.00–0.15)
TOTAL PROTEIN, URINE: 11 mg/dL

## 2018-03-10 LAB — POCT FERN TEST: POCT FERN TEST: POSITIVE

## 2018-03-10 LAB — GROUP B STREP BY PCR: Group B strep by PCR: NEGATIVE

## 2018-03-10 LAB — OB RESULTS CONSOLE GBS: GBS: NEGATIVE

## 2018-03-10 LAB — OB RESULTS CONSOLE GC/CHLAMYDIA: GC PROBE AMP, GENITAL: NEGATIVE

## 2018-03-10 MED ORDER — EPHEDRINE 5 MG/ML INJ
10.0000 mg | INTRAVENOUS | Status: DC | PRN
  Filled 2018-03-10: qty 2

## 2018-03-10 MED ORDER — FENTANYL CITRATE (PF) 100 MCG/2ML IJ SOLN
100.0000 ug | INTRAMUSCULAR | Status: DC | PRN
  Administered 2018-03-10: 100 ug via INTRAVENOUS
  Filled 2018-03-10: qty 2

## 2018-03-10 MED ORDER — SOD CITRATE-CITRIC ACID 500-334 MG/5ML PO SOLN: 30.0000 mL | ORAL | Status: DC | PRN

## 2018-03-10 MED ORDER — LIDOCAINE HCL (PF) 1 % IJ SOLN
INTRAMUSCULAR | Status: DC | PRN
  Administered 2018-03-10: 6 mL
  Administered 2018-03-10: 4 mL

## 2018-03-10 MED ORDER — PHENYLEPHRINE 40 MCG/ML (10ML) SYRINGE FOR IV PUSH (FOR BLOOD PRESSURE SUPPORT)
80.0000 ug | PREFILLED_SYRINGE | INTRAVENOUS | Status: DC | PRN
  Filled 2018-03-10: qty 5

## 2018-03-10 MED ORDER — OXYTOCIN 40 UNITS IN LACTATED RINGERS INFUSION - SIMPLE MED
2.5000 [IU]/h | INTRAVENOUS | Status: DC
  Filled 2018-03-10: qty 1000

## 2018-03-10 MED ORDER — OXYTOCIN BOLUS FROM INFUSION
500.0000 mL | Freq: Once | INTRAVENOUS | Status: AC
  Administered 2018-03-11: 500 mL via INTRAVENOUS

## 2018-03-10 MED ORDER — ONDANSETRON HCL 4 MG/2ML IJ SOLN: 4.0000 mg | Freq: Four times a day (QID) | INTRAMUSCULAR | Status: DC | PRN

## 2018-03-10 MED ORDER — LACTATED RINGERS IV SOLN
INTRAVENOUS | Status: DC
  Administered 2018-03-10 – 2018-03-11 (×3): via INTRAVENOUS

## 2018-03-10 MED ORDER — LIDOCAINE HCL (PF) 1 % IJ SOLN
30.0000 mL | INTRAMUSCULAR | Status: AC | PRN
  Administered 2018-03-11: 30 mL via SUBCUTANEOUS
  Filled 2018-03-10: qty 30

## 2018-03-10 MED ORDER — LACTATED RINGERS IV SOLN: 500.0000 mL | Freq: Once | INTRAVENOUS | Status: DC

## 2018-03-10 MED ORDER — FENTANYL 2.5 MCG/ML BUPIVACAINE 1/10 % EPIDURAL INFUSION (WH - ANES)
INTRAMUSCULAR | Status: AC
  Filled 2018-03-10: qty 100

## 2018-03-10 MED ORDER — PHENYLEPHRINE 40 MCG/ML (10ML) SYRINGE FOR IV PUSH (FOR BLOOD PRESSURE SUPPORT)
PREFILLED_SYRINGE | INTRAVENOUS | Status: AC
  Filled 2018-03-10: qty 20

## 2018-03-10 MED ORDER — TERBUTALINE SULFATE 1 MG/ML IJ SOLN
0.2500 mg | Freq: Once | INTRAMUSCULAR | Status: DC | PRN
  Filled 2018-03-10: qty 1

## 2018-03-10 MED ORDER — MISOPROSTOL 50MCG HALF TABLET
50.0000 ug | ORAL_TABLET | ORAL | Status: DC | PRN
  Administered 2018-03-10 (×2): 50 ug via ORAL
  Filled 2018-03-10 (×3): qty 1

## 2018-03-10 MED ORDER — DIPHENHYDRAMINE HCL 50 MG/ML IJ SOLN: 12.5000 mg | INTRAMUSCULAR | Status: DC | PRN

## 2018-03-10 MED ORDER — FENTANYL 2.5 MCG/ML BUPIVACAINE 1/10 % EPIDURAL INFUSION (WH - ANES)
14.0000 mL/h | INTRAMUSCULAR | Status: DC | PRN
  Administered 2018-03-10 – 2018-03-11 (×2): 14 mL/h via EPIDURAL
  Filled 2018-03-10: qty 100

## 2018-03-10 MED ORDER — OXYTOCIN 40 UNITS IN LACTATED RINGERS INFUSION - SIMPLE MED
1.0000 m[IU]/min | INTRAVENOUS | Status: DC
  Administered 2018-03-10: 2 m[IU]/min via INTRAVENOUS

## 2018-03-10 MED ORDER — OXYCODONE-ACETAMINOPHEN 5-325 MG PO TABS: 2.0000 | ORAL_TABLET | ORAL | Status: DC | PRN

## 2018-03-10 MED ORDER — LACTATED RINGERS IV SOLN: 500.0000 mL | INTRAVENOUS | Status: DC | PRN

## 2018-03-10 MED ORDER — OXYCODONE-ACETAMINOPHEN 5-325 MG PO TABS: 1.0000 | ORAL_TABLET | ORAL | Status: DC | PRN

## 2018-03-10 MED ORDER — ACETAMINOPHEN 325 MG PO TABS
650.0000 mg | ORAL_TABLET | ORAL | Status: DC | PRN
  Administered 2018-03-10: 650 mg via ORAL
  Filled 2018-03-10: qty 2

## 2018-03-10 NOTE — Progress Notes (Signed)
Patient ID: Jeanette [name], female   DOB: [birth year], 22 y.o.   MRN: [id number] Jeanette [name] is a 22 y.o. G1P0000 at 7045w0d admitted for PROM  Subjective: No complaints. Denies ha, visual changes, ruq/epigastric pain, n/v.    Objective: BP (!) 145/78 (BP Location: Right Arm)   Pulse 84   Temp 97.8 F (36.6 C) (Oral)   Resp 20   Ht 5\' 3"  (1.6 m)   Wt 117.5 kg (259 lb)   LMP 06/10/2017   BMI 45.88 kg/m  No intake/output data recorded.  FHT:  FHR: 125 bpm, variability: moderate,  accelerations:  Present,  decelerations:  Absent UC:   irregular, every 1-7 minutes  SVE:   Dilation: 1.5 Effacement (%): 50 Station: -2 Exam by:: Joellyn HaffKim Ciarra Braddy CNM Cervical foley bulb inserted and inflated w/ 60ml LR w/o difficulty   Labs: Lab Results  Component Value Date   WBC 10.2 03/10/2018   HGB 11.3 (L) 03/10/2018   HCT 34.2 (L) 03/10/2018   MCV 88.4 03/10/2018   PLT 323 03/10/2018    Assessment / Plan: PROM, s/p cytotec x 2, foley bulb now in, bp slightly elevated x 2 since admission, asymptomatic, will add cmp, P:C ratio  Labor: cervical ripening Fetal Wellbeing:  Category I Pain Control:  n/a Pre-eclampsia: labs pending I/D:  n/a Anticipated MOD:  NSVD  Cheral MarkerKimberly R Cheetara Hoge CNM, WHNP-BC 03/10/2018, 4:44 PM

## 2018-03-10 NOTE — Progress Notes (Signed)
Patient ID: Albertha GheeJazwlequa Frediani, female   DOB: [birth year], 22 y.o.   MRN: [id number] Albertha GheeJazwlequa Breighner is a 22 y.o. G1P0000 at 10522w0d admitted for induction of labor due to PROM.  Subjective: No complaints, not feeling any uc's  Objective: BP 117/80 (BP Location: Right Arm)   Pulse 100   Temp 98.5 F (36.9 C) (Oral)   Resp 20   Ht 5\' 3"  (1.6 m)   Wt 117.5 kg (259 lb)   LMP 06/10/2017   BMI 45.88 kg/m  No intake/output data recorded.  FHT:  FHR: 135 bpm, variability: moderate,  accelerations:  Present,  decelerations:  Absent UC:   irregular  SVE:   Dilation: Fingertip Station: -3 Exam by:: katherine g jones Rn   Vtx confirmed by informal transabdominal u/s  Labs: Lab Results  Component Value Date   WBC 10.2 03/10/2018   HGB 11.3 (L) 03/10/2018   HCT 34.2 (L) 03/10/2018   MCV 88.4 03/10/2018   PLT 323 03/10/2018    Assessment / Plan: PROM @ 0544, clear fluid, irregular uc's not perceived by pt. Gave option of expectant management vs. cytotec, pt prefers cytotec, will start per protocol  Labor: not yet Fetal Wellbeing:  Category I Pain Control:  n/a Pre-eclampsia: n/a I/D:  GBS PCR neg Anticipated MOD:  NSVD  Cheral MarkerKimberly R Arvis Zwahlen CNM, WHNP-BC 03/10/2018, 9:33 AM

## 2018-03-10 NOTE — MAU Note (Signed)
About 0545 awoke and was wet with fld. Stood up and leaked more fld that ran down legs. Fld is clear. No pain. No bleeding

## 2018-03-10 NOTE — Anesthesia Pain Management Evaluation Note (Signed)
  CRNA Pain Management Visit Note  Patient: Jeanette [name], 22 y.o., female  "Hello I am a [facility] of the anesthesia [facility] at [facility]. We have an anesthesia team available at all times to provide care throughout the hospital, including epidural management and anesthesia for C-section. I don't know your plan for the delivery whether it a natural birth, water birth, IV sedation, nitrous supplementation, doula or epidural, but we want to meet your pain goals."   1.Was your pain managed to your expectations on prior hospitalizations?   No prior hospitalizations  2.What is your expectation for pain management during this hospitalization?     IV pain meds  3.How can we help you reach that goal? Support prn  Record the patient's initial score and the patient's pain goal.   Pain: 0  Pain Goal: 8 [facility] wants you to be able to say your pain was always managed very well.  [facility],Jeanette Zenz 03/10/2018

## 2018-03-10 NOTE — Anesthesia Preprocedure Evaluation (Addendum)
Anesthesia Evaluation  Patient identified by MRN, date of birth, ID band Patient awake    Reviewed: Allergy & Precautions, H&P , Patient's Chart, lab work & pertinent test results  Airway Mallampati: IV  TM Distance: >3 FB Neck ROM: full    Dental  (+) Teeth Intact   Pulmonary Current Smoker,    breath sounds clear to auscultation       Cardiovascular  Rhythm:regular Rate:Normal     Neuro/Psych    GI/Hepatic   Endo/Other  Morbid obesity  Renal/GU      Musculoskeletal   Abdominal   Peds  Hematology   Anesthesia Other Findings       Reproductive/Obstetrics (+) Pregnancy                            Anesthesia Physical Anesthesia Plan  ASA: III  Anesthesia Plan: Epidural   Post-op Pain Management:    Induction:   PONV Risk Score and Plan:   Airway Management Planned:   Additional Equipment:   Intra-op Plan:   Post-operative Plan:   Informed Consent: I have reviewed the patients History and Physical, chart, labs and discussed the procedure including the risks, benefits and alternatives for the proposed anesthesia with the patient or authorized representative who has indicated his/her understanding and acceptance.   Dental Advisory Given  Plan Discussed with:   Anesthesia Plan Comments: (Labs checked- platelets confirmed with RN in room. Fetal heart tracing, per RN, reported to be stable enough for sitting procedure. Discussed epidural, and patient consents to the procedure:  included risk of possible headache,backache, failed block, allergic reaction, and nerve injury. This patient was asked if she had any questions or concerns before the procedure started.)        Anesthesia Quick Evaluation

## 2018-03-10 NOTE — MAU Note (Signed)
Urine in lab 

## 2018-03-10 NOTE — Anesthesia Procedure Notes (Signed)
Epidural Patient location during procedure: OB  Staffing Anesthesiologist: Annely Sliva, MD  Preanesthetic Checklist Completed: patient identified, pre-op evaluation, timeout performed, IV checked, risks and benefits discussed and monitors and equipment checked  Epidural Patient position: sitting Prep: DuraPrep Patient monitoring: blood pressure and continuous pulse ox Approach: right paramedian Location: L3-L4 Injection technique: LOR air  Needle:  Needle type: Tuohy  Needle gauge: 17 G Needle insertion depth: 8 cm Catheter type: closed end flexible Catheter size: 19 Gauge Catheter at skin depth: 15 cm Test dose: negative  Assessment Sensory level: T8  Additional Notes    Dosing of Epidural:  1st dose, through catheter .............................................  Xylocaine 40 mg  2nd dose, through catheter, after waiting 3 minutes.........Xylocaine 60 mg    As each dose occurred, patient was free of IV sx; and patient exhibited no evidence of SA injection.  Patient is more comfortable after epidural dosed. Please see RN's note for documentation of vital signs,and FHR which are stable.  Patient reminded not to try to ambulate with numb legs, and that an RN must be present when she attempts to get up.         

## 2018-03-10 NOTE — Progress Notes (Addendum)
Patient ID: Jeanette [name], female   DOB: 03/27/1996, 22 y.o.   MRN: [id number] Jeanette [name] is a 22 y.o. G1P0000 at 332w0d admitted for PROM  Subjective: Foley bulb out  Objective: BP (!) 144/93   Pulse 82   Temp 97.8 F (36.6 C) (Oral)   Resp 20   Ht 5\' 3"  (1.6 m)   Wt 117.5 kg (259 lb)   LMP 06/10/2017   BMI 45.88 kg/m  No intake/output data recorded.  FHT:  FHR: 120 bpm, variability: moderate,  accelerations:  Present,  decelerations:  Absent UC:   q 2-595mins  SVE:   Dilation: 4 Effacement (%): 70 Station: -3 Exam by:: katherine g jones RN    Labs: Lab Results  Component Value Date   WBC 10.2 03/10/2018   HGB 11.3 (L) 03/10/2018   HCT 34.2 (L) 03/10/2018   MCV 88.4 03/10/2018   PLT 323 03/10/2018    Assessment / Plan: IOL d/t PROM, s/p cytotec x 2, foley bulb out, will start pitocin per protocol  Labor: s/p ripening phase Fetal Wellbeing:  Category I Pain Control:  IV pain meds Pre-eclampsia: intrapartum GHTN, asymptomatic, cbc/cmp normal, P:C ratio pending I/D:  n/a Anticipated MOD:  NSVD  Cheral MarkerKimberly R Crystal Scarberry CNM, WHNP-BC 03/10/2018, 6:31 PM

## 2018-03-10 NOTE — H&P (Signed)
LABOR AND DELIVERY ADMISSION HISTORY AND PHYSICAL NOTE  Jeanette [name] is a 22 y.o. female G1P0000 with IUP at [redacted]w[redacted]d by 1st trimester [place] presenting for SROM @0545 .  She reports positive fetal movement. She denies abdominal cramping, contractions or vaginal bleeding.  Prenatal History/Complications: PNC at [facility]- has not been seen for care since 32 weeks  Pregnancy complications:  - Limited Prenatal care in third trimester   Past Medical History: Past Medical History:  Diagnosis Date  . Medical history non-contributory   . Obesity     Past Surgical History: Past Surgical History:  Procedure Laterality Date  . NO PAST SURGERIES      Obstetrical History: OB History    Gravida  1   Para  0   Term  0   Preterm  0   AB  0   Living  0     SAB  0   TAB  0   Ectopic  0   Multiple  0   Live Births              Social History: Social History   Socioeconomic History  . Marital status: Single    Spouse name: Not on file  . Number of children: Not on file  . Years of education: Not on file  . Highest education level: Not on file  Occupational History  . Not on file  Social Needs  . Financial resource strain: Not on file  . Food insecurity:    Worry: Not on file    Inability: Not on file  . Transportation needs:    Medical: Not on file    Non-medical: Not on file  Tobacco Use  . Smoking status: Current Some Day Smoker    Last attempt to quit: 07/15/2017    Years since quitting: 0.6  . Smokeless tobacco: Never Used  Substance and Sexual Activity  . Alcohol use: No  . Drug use: No  . Sexual activity: Yes    Birth control/protection: None  Lifestyle  . Physical activity:    Days per week: Not on file    Minutes per session: Not on file  . Stress: Not on file  Relationships  . Social connections:    Talks on phone: Not on file    Gets together: Not on file    Attends religious service: Not on file    Active member of club or organization: Not on  file    Attends meetings of clubs or organizations: Not on file    Relationship status: Not on file  Other Topics Concern  . Not on file  Social History Narrative  . Not on file    Family History: History reviewed. No pertinent family history.  Allergies: No Known Allergies  Medications Prior to Admission  Medication Sig Dispense Refill Last Dose  . Prenatal Vit-Fe Fumarate-FA (PRENATAL VITAMINS) 28-0.8 MG TABS Take 1 tablet by mouth daily. 30 tablet 6 03/09/2018 at Unknown time     Review of Systems  All systems reviewed and negative except as stated in HPI  Physical Exam Blood pressure 137/70, pulse 89, temperature 98.2 F (36.8 C), resp. rate 18, height 5\' 3"  (1.6 m), weight 259 lb (117.5 kg), last menstrual period 06/10/2017. General appearance: alert, cooperative and no distress Lungs: clear to auscultation bilaterally Heart: regular rate and rhythm Abdomen: soft, non-tender; bowel sounds normal Extremities: No calf swelling or tenderness Presentation: cephalic Pelvic exam: Positive pooling  Fetal monitoring: 130/ moderate/ +accels/ no decels  Uterine activity: occasional contractions  Dilation: Closed Exam by:: Steward DroneVeronica Tameeka Luo CNM  Prenatal labs: ABO, Rh: A/Positive/-- (10/17 1549) Antibody: Negative (10/17 1549) Rubella: 2.53 (10/17 1549) RPR: Non Reactive (10/17 1549)  HBsAg: Negative (10/17 1549)  HIV: Non Reactive (10/17 1549)  GC/Chlamydia: Negative (10/17) GBS:   pending  2 hr Glucola: 69-97-115 Genetic screening:  declined Anatomy US: normal   [facility] P[facility] Labs  Dating  1st trimester US Blood type: A/Positive/-- (10/17 1549)   Genetic Screen 1 Screen:  NL  AFP:     Quad:     NIPS: Antibody:Negative (10/17 1549)  Anatomic US Inadequate views of heart, otherwise normal> normal fup scan Rubella: 2.53 (10/17 1549)  GTT Early:               Third trimester: 69/97/115 RPR: Non Reactive (10/17 1549)  Flu vaccine  10/02/17 HBsAg: Negative  (10/17 1549)   TDaP vaccine   12/19/17                                            Rhogam: HIV: Non Reactive (10/17 1549)  Baby Food Breast                                      GBS: (For PCN allergy, check sensitivities)  Contraception  Unsure  Pap: under 21  Circumcision Female fetus   Pediatrician List given  CF:  Support Person Diamante SMA  Prenatal Classes Info given  Hgb electrophoresis:     Prenatal Transfer Tool  Maternal Diabetes: No Genetic Screening: Declined Maternal Ultrasounds/Referrals: Normal Fetal Ultrasounds or other Referrals:  None Maternal Substance Abuse:  No Significant Maternal Medications:  None Significant Maternal Lab Results: None  Results for orders placed or performed during the hospital encounter of 03/10/18 (from the past 24 hour(s))  POCT fern test   Collection Time: 03/10/18  7:19 AM  Result Value Ref Range   POCT Fern Test Positive = ruptured amniotic membanes     Patient Active Problem List   Diagnosis Date Noted  . Obesity affecting pregnancy, antepartum 09/13/2017  . Supervision of normal first pregnancy, antepartum 09/06/2017  . Abdominal pain affecting pregnancy 07/22/2017    Assessment: Jeanette [name] is a 22 y.o. G1P0000 at 6120w0d here for SROM @0545 - clear fluid.   #Labor: Possible IOL with cytotec for PROM prior to onset of labor #Pain: Medications ordered PRN  #FWB: Cat I #ID:  GBS pending  #MOF: Breast  #MOC:Unsure  #Circ:  N/A- girl   Sharyon CableRogers, Lenin Kuhnle C, CNM 03/10/2018, 7:20 AM

## 2018-03-10 NOTE — Progress Notes (Signed)
LABOR PROGRESS NOTE  Jeanette [name] is a 22 y.o. G1P0000 at 473w0d  admitted for PROM @0545   Subjective: Patient not coping well, screaming through contractions- requesting pain medication but is concerned about the epidural. Discussed with patient risks and benefits of epidural- answered patient's questions. Patient request epidural at this time.   Objective: BP 127/72   Pulse 94   Temp 98.2 F (36.8 C) (Oral)   Resp 18   Ht 5\' 3"  (1.6 m)   Wt 259 lb (117.5 kg)   LMP 06/10/2017   SpO2 98%   BMI 45.88 kg/m  or  Vitals:   03/10/18 2134 03/10/18 2135 03/10/18 2139 03/10/18 2140  BP:  133/71  127/72  Pulse:  91  94  Resp:  18  18  Temp:      TempSrc:      SpO2: 98%  98%   Weight:      Height:        IUPC placed @2057   Dilation: 5 Effacement (%): 80 Cervical Position: Middle Station: -2 Presentation: Vertex Exam by:: Steward DroneVeronica Nery Frappier CNM FHT: baseline rate 140, moderate varibility, +acel, no decel Toco: 1-2/ moderate by palpation   Labs: Lab Results  Component Value Date   WBC 10.2 03/10/2018   HGB 11.3 (L) 03/10/2018   HCT 34.2 (L) 03/10/2018   MCV 88.4 03/10/2018   PLT 323 03/10/2018    Patient Active Problem List   Diagnosis Date Noted  . PROM (premature rupture of membranes) 03/10/2018  . Gestational hypertension 03/10/2018  . Obesity affecting pregnancy, antepartum 09/13/2017  . Supervision of normal first pregnancy, antepartum 09/06/2017  . Abdominal pain affecting pregnancy 07/22/2017    Assessment / Plan: 22 y.o. G1P0000 at 413w0d here for PROM @0545   Labor: Slow progress, continue pitocin titration to adequate contractions with IUPC  Fetal Wellbeing:  Cat I Pain Control:  Epidural requested  Anticipated MOD:  SVD   Sharyon CableRogers, Demond Shallenberger C, CNM 03/10/2018, 9:44 PM

## 2018-03-11 DIAGNOSIS — Z3A38 38 weeks gestation of pregnancy: Secondary | ICD-10-CM | POA: Diagnosis not present

## 2018-03-11 LAB — RPR: RPR Ser Ql: NONREACTIVE

## 2018-03-11 MED ORDER — BENZOCAINE-MENTHOL 20-0.5 % EX AERO
1.0000 "application " | INHALATION_SPRAY | CUTANEOUS | Status: DC | PRN
  Administered 2018-03-11: 1 via TOPICAL
  Filled 2018-03-11: qty 56

## 2018-03-11 MED ORDER — PNEUMOCOCCAL VAC POLYVALENT 25 MCG/0.5ML IJ INJ
0.5000 mL | INJECTION | INTRAMUSCULAR | Status: DC
  Filled 2018-03-11: qty 0.5

## 2018-03-11 MED ORDER — WITCH HAZEL-GLYCERIN EX PADS: 1.0000 "application " | MEDICATED_PAD | CUTANEOUS | Status: DC | PRN

## 2018-03-11 MED ORDER — SENNOSIDES-DOCUSATE SODIUM 8.6-50 MG PO TABS
2.0000 | ORAL_TABLET | ORAL | Status: DC
  Administered 2018-03-11: 2 via ORAL
  Filled 2018-03-11 (×2): qty 2

## 2018-03-11 MED ORDER — SIMETHICONE 80 MG PO CHEW: 80.0000 mg | CHEWABLE_TABLET | ORAL | Status: DC | PRN

## 2018-03-11 MED ORDER — ZOLPIDEM TARTRATE 5 MG PO TABS: 5.0000 mg | ORAL_TABLET | Freq: Every evening | ORAL | Status: DC | PRN

## 2018-03-11 MED ORDER — ACETAMINOPHEN 325 MG PO TABS: 650.0000 mg | ORAL_TABLET | ORAL | Status: DC | PRN

## 2018-03-11 MED ORDER — COCONUT OIL OIL
1.0000 "application " | TOPICAL_OIL | Status: DC | PRN
  Administered 2018-03-12: 1 via TOPICAL
  Filled 2018-03-11: qty 120

## 2018-03-11 MED ORDER — PRENATAL MULTIVITAMIN CH
1.0000 | ORAL_TABLET | Freq: Every day | ORAL | Status: DC
Start: 2018-03-11 — End: 2018-03-13
  Administered 2018-03-11 – 2018-03-13 (×3): 1 via ORAL
  Filled 2018-03-11 (×3): qty 1

## 2018-03-11 MED ORDER — TETANUS-DIPHTH-ACELL PERTUSSIS 5-2.5-18.5 LF-MCG/0.5 IM SUSP: 0.5000 mL | Freq: Once | INTRAMUSCULAR | Status: DC

## 2018-03-11 MED ORDER — DIPHENHYDRAMINE HCL 25 MG PO CAPS: 25.0000 mg | ORAL_CAPSULE | Freq: Four times a day (QID) | ORAL | Status: DC | PRN

## 2018-03-11 MED ORDER — IBUPROFEN 600 MG PO TABS
600.0000 mg | ORAL_TABLET | Freq: Four times a day (QID) | ORAL | Status: DC
  Administered 2018-03-11 – 2018-03-13 (×10): 600 mg via ORAL
  Filled 2018-03-11 (×10): qty 1

## 2018-03-11 MED ORDER — ONDANSETRON HCL 4 MG/2ML IJ SOLN: 4.0000 mg | INTRAMUSCULAR | Status: DC | PRN

## 2018-03-11 MED ORDER — DIBUCAINE 1 % RE OINT: 1.0000 "application " | TOPICAL_OINTMENT | RECTAL | Status: DC | PRN

## 2018-03-11 MED ORDER — ONDANSETRON HCL 4 MG PO TABS: 4.0000 mg | ORAL_TABLET | ORAL | Status: DC | PRN

## 2018-03-11 NOTE — Lactation Note (Signed)
This note was copied from a baby's chart. Lactation Consultation Note  Lactation brochure given to mother. This is mothers first baby. She is not active with WIC and didn't attend any breastfeeding classes.   Infant is in crib across the room. Mother has been sat up with a DEBP. Mother was pumping breast when I arrived in her room. Staff nurse assist mother with hand expression and spoon feeding.  Infant is 10 hours old. She has not shown feeding cues. Encouraged mother to do frequent skin to skin.  Assist mother with hand expressing . Observed large drops of colostrum. Infant was placed in football position. Advised mother in latching infant with nipple to nose latch technique. Infant latched on and off for a few sucks.. Mother taught cross cradle hold. Infant was attempting to latch but mother was not comfortable in this position.  Infant placed on alternate breast in football hold. Suck training done with gloved finger. Infant tongue thrust gloved finger. After several attempts infant latched on for approx 10 mins. Observed suckling and a few swallows.  Advised mother to cue base feed and feed infant at least 8-12 times in 24 hours. Lots of teaching with mother. Mother reports that she has not had much experience with newborns. Advised mother to learn from the staff while in the hospital and page for assistance frequently to get assistance. Mother informed of available Lc services and community support.   Patient Name: Jeanette [name]: 03/11/2018 Reason for consult: Initial assessment   Maternal Data    Feeding Feeding Type: Breast Fed Length of feed: 10 min  LATCH Score Latch: Grasps breast easily, tongue down, lips flanged, rhythmical sucking.  Audible Swallowing: A few with stimulation  Type of Nipple: Everted at rest and after stimulation  Comfort (Breast/Nipple): Soft / non-tender  Hold (Positioning): Assistance needed to correctly position infant at  breast and maintain latch.  LATCH Score: 8  Interventions Interventions: Assisted with latch  Lactation Tools Discussed/Used     Consult Status Consult Status: Follow-up [name]: 03/12/18 Follow-up type: In-patient    Stevan BornKendrick, Peggye [facility] 03/11/2018, 3:10 PM

## 2018-03-11 NOTE — Anesthesia Postprocedure Evaluation (Signed)
Anesthesia Post Note  Patient: Jeanette [name]  Procedure(s) Performed: AN AD HOC LABOR EPIDURAL     Patient location during evaluation: Mother Baby Anesthesia Type: Epidural Level of consciousness: awake and alert, oriented and patient cooperative Pain management: pain level controlled Vital Signs Assessment: post-procedure vital signs reviewed and stable Respiratory status: spontaneous breathing Cardiovascular status: stable Postop Assessment: no headache, epidural receding, patient able to bend at knees and no signs of nausea or vomiting Anesthetic complications: no Comments: Pain score 0.    Last Vitals:  Vitals:   03/11/18 0754 03/11/18 1227  BP: 135/71 130/79  Pulse: 98 77  Resp: 16 20  Temp: 37.1 C 36.6 C  SpO2: 99% 100%    Last Pain:  Vitals:   03/11/18 1227  TempSrc: Oral  PainSc:    Pain Goal:                 [facility],Teague Goynes

## 2018-03-12 ENCOUNTER — Encounter: Payer: Medicaid Other | Admitting: Family Medicine

## 2018-03-12 NOTE — Progress Notes (Signed)
CSW received consult for [facility], however, upon chart review, CSW notes that she had greater than 3 visits and started care before 28 weeks.  Therefore, she does not meet criteria for an automatic CSW consult of infant drug screening.  Please consult CSW if acute concerns arise or by MOB's request.

## 2018-03-12 NOTE — Lactation Note (Signed)
This note was copied from a baby's chart. Lactation Consultation Note  Patient Name: Girl Albertha GheeJazwlequa Sevin WUJWJ'XToday's Date: 03/12/2018 Reason for consult: Follow-up assessment   Baby 34 hours old and cueing. Reviewed hand expression w/ drops expressed. Assisted w/ latching baby in football hold. Intermittent sucks and swallows observed. Encouraged mother to compress breast to keep baby active. Discussed basics.  Mom encouraged to feed baby 8-12 times/24 hours and with feeding cues.   Maternal Data Has patient been taught Hand Expression?: Yes Does the patient have breastfeeding experience prior to this delivery?: No  Feeding Feeding Type: Breast Fed Length of feed: 10 min  LATCH Score Latch: Grasps breast easily, tongue down, lips flanged, rhythmical sucking.  Audible Swallowing: A few with stimulation  Type of Nipple: Everted at rest and after stimulation  Comfort (Breast/Nipple): Soft / non-tender  Hold (Positioning): Assistance needed to correctly position infant at breast and maintain latch.  LATCH Score: 8  Interventions Interventions: Breast feeding basics reviewed;Assisted with latch;Hand express;Breast compression;Position options  Lactation Tools Discussed/Used     Consult Status Consult Status: Follow-up Date: 03/13/18 Follow-up type: In-patient    Dahlia ByesBerkelhammer, Camya Haydon [facility] 03/12/2018, 2:47 PM

## 2018-03-12 NOTE — Progress Notes (Signed)
Post Partum Day 1 Subjective: no complaints, up ad lib, voiding and tolerating PO  Objective: Blood pressure 126/74, pulse 82, temperature 97.8 F (36.6 C), temperature source Oral, resp. rate 18, height 5\' 3"  (1.6 m), weight 259 lb (117.5 kg), last menstrual period 06/10/2017, SpO2 100 %.  Physical Exam:  General: alert, cooperative and appears stated age Lochia: appropriate Uterine Fundus: firm DVT Evaluation: No evidence of DVT seen on physical exam.  Recent Labs    03/10/18 0816  HGB 11.3*  HCT 34.2*    Assessment/Plan: Plan for discharge tomorrow, Breastfeeding and Contraception undecided   LOS: 2 days   Reva Boresanya S Eamonn Sermeno 03/12/2018, 7:44 AM

## 2018-03-13 ENCOUNTER — Encounter (HOSPITAL_COMMUNITY): Payer: Self-pay | Admitting: *Deleted

## 2018-03-13 MED ORDER — IBUPROFEN 600 MG PO TABS: 600.0000 mg | ORAL_TABLET | Freq: Four times a day (QID) | ORAL | 0 refills | Status: DC

## 2018-03-13 NOTE — Lactation Note (Signed)
This note was copied from a baby's chart. Lactation Consultation Note  Patient Name: Jeanette [name] NWGNF'AToday's Date: 03/13/2018  Mom c/o severe pain with latching.  She has decided to pump and bottle feed until she is healed.  Comfort gels given with instructions.  Mom will get a WIC pump prior to discharge.  Instructed to pump every 3 hours to establish and maintain a milk supply.  Baby is getting formula supplementation also.   Maternal Data    Feeding    LATCH Score                   Interventions    Lactation Tools Discussed/Used     Consult Status      [name], Akshaya Toepfer S 03/13/2018, 11:36 AM

## 2018-03-13 NOTE — Discharge Instructions (Signed)
Postpartum Care After Vaginal Delivery °The period of time right after you deliver your newborn is called the postpartum period. °What kind of medical care will I receive? °· You may continue to receive fluids and medicines through an IV tube inserted into one of your veins. °· If an incision was made near your vagina (episiotomy) or if you had some vaginal tearing during delivery, cold compresses may be placed on your episiotomy or your tear. This helps to reduce pain and swelling. °· You may be given a squirt bottle to use when you go to the bathroom. You may use this until you are comfortable wiping as usual. To use the squirt bottle, follow these steps: °? Before you urinate, fill the squirt bottle with warm water. Do not use hot water. °? After you urinate, while you are sitting on the toilet, use the squirt bottle to rinse the area around your urethra and vaginal opening. This rinses away any urine and blood. °? You may do this instead of wiping. As you start healing, you may use the squirt bottle before wiping yourself. Make sure to wipe gently. °? Fill the squirt bottle with clean water every time you use the bathroom. °· You will be given sanitary pads to wear. °How can I expect to feel? °· You may not feel the need to urinate for several hours after delivery. °· You will have some soreness and pain in your abdomen and vagina. °· If you are breastfeeding, you may have uterine contractions every time you breastfeed for up to several weeks postpartum. Uterine contractions help your uterus return to its normal size. °· It is normal to have vaginal bleeding (lochia) after delivery. The amount and appearance of lochia is often similar to a menstrual period in the first week after delivery. It will gradually decrease over the next few weeks to a dry, yellow-brown discharge. For most women, lochia stops completely by 6-8 weeks after delivery. Vaginal bleeding can vary from woman to woman. °· Within the first few  days after delivery, you may have breast engorgement. This is when your breasts feel heavy, full, and uncomfortable. Your breasts may also throb and feel hard, tightly stretched, warm, and tender. After this occurs, you may have milk leaking from your breasts. Your health care provider can help you relieve discomfort due to breast engorgement. Breast engorgement should go away within a few days. °· You may feel more sad or worried than normal due to hormonal changes after delivery. These feelings should not last more than a few days. If these feelings do not go away after several days, speak with your health care provider. °How should I care for myself? °· Tell your health care provider if you have pain or discomfort. °· Drink enough water to keep your urine clear or pale yellow. °· Wash your hands thoroughly with soap and water for at least 20 seconds after changing your sanitary pads, after using the toilet, and before holding or feeding your baby. °· If you are not breastfeeding, avoid touching your breasts a lot. Doing this can make your breasts produce more milk. °· If you become weak or lightheaded, or you feel like you might faint, ask for help before: °? Getting out of bed. °? Showering. °· Change your sanitary pads frequently. Watch for any changes in your flow, such as a sudden increase in volume, a change in color, the passing of large blood clots. If you pass a blood clot from your vagina, save it   to show to your health care provider. Do not flush blood clots down the toilet without having your health care provider look at them. °· Make sure that all your vaccinations are up to date. This can help protect you and your baby from getting certain diseases. You may need to have immunizations done before you leave the hospital. °· If desired, talk with your health care provider about methods of family planning or birth control (contraception). °How can I start bonding with my baby? °Spending as much time as  possible with your baby is very important. During this time, you and your baby can get to know each other and develop a bond. Having your baby stay with you in your room (rooming in) can give you time to get to know your baby. Rooming in can also help you become comfortable caring for your baby. Breastfeeding can also help you bond with your baby. °How can I plan for returning home with my baby? °· Make sure that you have a car seat installed in your vehicle. °? Your car seat should be checked by a certified car seat installer to make sure that it is installed safely. °? Make sure that your baby fits into the car seat safely. °· Ask your health care provider any questions you have about caring for yourself or your baby. Make sure that you are able to contact your health care provider with any questions after leaving the hospital. °This information is not intended to replace advice given to you by your health care provider. Make sure you discuss any questions you have with your health care provider. °Document Released: 09/04/2007 Document Revised: 04/11/2016 Document Reviewed: 10/12/2015 °Elsevier Interactive Patient Education © 2018 [employer] ° °

## 2018-03-13 NOTE — Discharge Summary (Addendum)
OB Discharge Summary     Patient Name: Jeanette [name] DOB: December 25, 1995 MRN: [id number]  Date of admission: 03/10/2018 Delivering MD: Sharyon Cable   Date of discharge: 03/13/2018  Admitting diagnosis: 38 wks water broke Intrauterine pregnancy: [redacted]w[redacted]d     Secondary diagnosis:  Principal Problem:   Supervision of normal first pregnancy, antepartum Active Problems:   Obesity affecting pregnancy, antepartum   PROM (premature rupture of membranes)   Gestational hypertension  Additional problems: Limited PNC after 32 weeks, intrapartum GHTN, premature rupture of membranes     Discharge diagnosis: Term Pregnancy Delivered and Gestational Hypertension                                                                                                Post partum procedures:none  Augmentation: Induction of labor with misoprostol, pitocin, and foley  Complications: None  Hospital course:  Induction of Labor With Vaginal Delivery   22 y.o. yo G1P0000 at [redacted]w[redacted]d was admitted to the hospital 03/10/2018 for induction of labor.  Indication for induction: PROM.  Patient had a labor course remarkable for some elevated BPs (less than severe range) but lab work not showing evidence of pre-e. She progressed through the induction process to SVD- shoulder dystocia noted in delivery summary, but unsure of the length of time/level of difficulty in releasing shoulder.  Membrane Rupture Time/Date: 5:44 AM ,03/10/2018   Intrapartum Procedures: Episiotomy: None [1]                                         Lacerations:  2nd degree [3];Perineal [11]  Patient had delivery of a Viable infant.  Information for the patient's newborn:  Jeanette, [name] [[id number]]  Delivery Method: Vag-Spont   03/11/2018  Details of delivery can be found in separate delivery note.  Patient had a routine postpartum course. Patient is discharged home 03/13/18.  Physical exam  Vitals:   03/11/18 2020 03/12/18 0454 03/12/18  1755 03/13/18 0522  BP: 115/60 126/74 139/74 134/79  Pulse: 84 82 76 80  Resp: 19 18 19 18   Temp: 97.8 F (36.6 C) 97.8 F (36.6 C) 98.4 F (36.9 C) 97.8 F (36.6 C)  TempSrc: Oral Oral  Oral  SpO2: 100%  100%   Weight:      Height:       General: alert, cooperative and no distress Lochia: appropriate Uterine Fundus: firm Incision: N/A DVT Evaluation: No evidence of DVT seen on physical exam. Labs: Lab Results  Component Value Date   WBC 10.2 03/10/2018   HGB 11.3 (L) 03/10/2018   HCT 34.2 (L) 03/10/2018   MCV 88.4 03/10/2018   PLT 323 03/10/2018   CMP Latest Ref Rng & Units 03/10/2018  Glucose 65 - 99 mg/dL 74  BUN 6 - 20 mg/dL 7  Creatinine 7.82 - 9.56 mg/dL 2.13  Sodium 086 - 578 mmol/L 135  Potassium 3.5 - 5.1 mmol/L 3.6  Chloride 101 - 111 mmol/L 108  CO2 22 - 32 mmol/L 17(L)  Calcium 8.9 - 10.3 mg/dL 8.9  Total Protein 6.5 - 8.1 g/dL 7.2  Total Bilirubin 0.3 - 1.2 mg/dL 0.7  Alkaline Phos 38 - 126 U/L 166(H)  AST 15 - 41 U/L 19  ALT 14 - 54 U/L 10(L)    Discharge instruction: per After Visit Summary and "Baby and Me Booklet".  After visit meds:  Allergies as of 03/13/2018   No Known Allergies     Medication List    TAKE these medications   ibuprofen 600 MG tablet Commonly known as:  ADVIL,MOTRIN Take 1 tablet (600 mg total) by mouth every 6 (six) hours.   Prenatal Vitamins 28-0.8 MG Tabs Take 1 tablet by mouth daily.       Diet: routine diet  Activity: Advance as tolerated. Pelvic rest for 6 weeks.   Outpatient follow up:1 week for BP check, then 4 wk PP visit Follow up Appt: Future Appointments  Date Time Provider Department Center  04/18/2018  3:15 PM Armando ReichertHogan, Heather D, CNM [facility]   Follow up Visit:No follow-ups on file.  Postpartum contraception: Progesterone only pills  Newborn Data: Live born female  Birth Weight: 7 lb 2.6 oz (3249 g) APGAR: 5, 9  Newborn Delivery   Time head delivered:  03/11/2018 04:26:00 Birth  date/time:  03/11/2018 04:27:00 Delivery type:  Vaginal, Spontaneous     Baby Feeding: Breast Disposition:home with mother   03/13/2018 Jeanette HarmsMegan Campbell, MD  CNM attestation I have seen and examined this patient and agree with above documentation in the resident's note.   Jeanette [name] is a 22 y.o. G1P0000 s/p SVD after IOL for PROM.   Pain is well controlled.  Plan for birth control is oral progesterone-only contraceptive.  Method of Feeding: breast  PE:  BP 134/79 (BP Location: Left Arm)   Pulse 80   Temp 97.8 F (36.6 C) (Oral)   Resp 18   Ht 5\' 3"  (1.6 m)   Wt 117.5 kg (259 lb)   LMP 06/10/2017   SpO2 100%   BMI 45.88 kg/m  Fundus firm  No results for input(s): HGB, HCT in the last 72 hours.   Plan: discharge today - postpartum care discussed - f/u clinic in 1 wk for BP check, then 4 weeks for postpartum visit   Jeanette [name], Jeanette [name], CNM 1:14 PM 03/13/2018

## 2018-03-14 ENCOUNTER — Telehealth: Payer: Self-pay | Admitting: General Practice

## 2018-03-14 ENCOUNTER — Ambulatory Visit: Payer: Self-pay

## 2018-03-14 NOTE — Telephone Encounter (Signed)
Called patient to schedule BP check for next week.  Left message on VM for patient to give our office a call back to schedule.

## 2018-03-14 NOTE — Lactation Note (Signed)
This note was copied from a baby's chart. Lactation Consultation Note  Patient Name: Jeanette [name] SPVIC'F Date: 03/14/2018 Reason for consult: Hyperbilirubinemia  Mom reports minor breast changes with pregnancy & that her breasts feel slightly heavier since birth. Mom most recently pumped 19m. Per her report, the most EBM she has obtained is 396m Mom says that she pumps for 30 min at a time, but has only done so three-four times today so far. I encouraged Mom to pump more often. Mom was found to be using the size 27 flanges. I suggested that she try the size 24s (and pre-lubricate them with coconut oil). I disassembled pump parts so they could soak; I asked Dad to wash them soon so that they'll be ready next time Mom pumps.  Mom says she is not active with WIC. Mom was shown how to assemble & use hand pump that was included in pump kit.   Mom noted to have compression stripes on tips of both nipples. Comfort Gels are in room. Per flow sheet, Mom has put infant to breast twice today. Mom to call me with next feeding at breast.. Infant gained 2.5 oz over 24 hours; however, current intake will not sustain that. Mom told to increase volumes with bottle feeding (around 45-6057mor the time being). Mom praised for continuing to wake baby for feeds.   Dad inquired about pacifier use. I recommended against it explaining that if infant needs to suck, then she likely needs to be fed.   NatLanelle BalN was made aware that infant needs to increase volumes with bottle feeding.   RicMatthias [facility]smCap[facility]24/2019, 4:22 PM

## 2018-03-15 ENCOUNTER — Ambulatory Visit: Payer: Self-pay

## 2018-03-15 NOTE — Lactation Note (Signed)
This note was copied from a baby's chart. Lactation Consultation Note  Patient Name: Jeanette Albertha GheeJazwlequa Casco ZOXWR'UToday's Date: 03/15/2018  Mom continues to pump and bottle feed.  She is obtaining 15mls +.  Reminded to pump every 3 hours to establish and maintain her milk supply.  She [facility] to discharge.   Maternal Data    Feeding    LATCH Score                   Interventions    Lactation Tools Discussed/Used     Consult Status      [name], [name] 03/15/2018, 9:05 AM

## 2018-03-19 ENCOUNTER — Encounter: Payer: Medicaid Other | Admitting: Advanced Practice Midwife

## 2018-03-26 ENCOUNTER — Encounter: Payer: Medicaid Other | Admitting: Advanced Practice Midwife

## 2018-04-18 ENCOUNTER — Ambulatory Visit: Payer: Medicaid Other | Admitting: Advanced Practice Midwife

## 2018-04-18 ENCOUNTER — Encounter: Payer: Self-pay | Admitting: Advanced Practice Midwife

## 2018-04-23 ENCOUNTER — Encounter: Payer: Self-pay | Admitting: Student

## 2018-04-30 ENCOUNTER — Ambulatory Visit: Payer: Medicaid Other | Admitting: Advanced Practice Midwife

## 2018-06-25 ENCOUNTER — Encounter (INDEPENDENT_AMBULATORY_CARE_PROVIDER_SITE_OTHER): Payer: Self-pay

## 2018-06-27 ENCOUNTER — Ambulatory Visit: Payer: Self-pay | Admitting: Nurse Practitioner

## 2018-07-17 ENCOUNTER — Ambulatory Visit (INDEPENDENT_AMBULATORY_CARE_PROVIDER_SITE_OTHER): Payer: Self-pay | Admitting: General Practice

## 2018-07-17 DIAGNOSIS — Z3201 Encounter for pregnancy test, result positive: Secondary | ICD-10-CM

## 2018-07-17 LAB — POCT PREGNANCY, URINE: Preg Test, Ur: POSITIVE — AB

## 2018-07-17 MED ORDER — PRENATAL VITAMINS 0.8 MG PO TABS: 1.0000 | ORAL_TABLET | Freq: Every day | ORAL | 12 refills | Status: DC

## 2018-07-17 NOTE — Progress Notes (Signed)
I have reviewed the chart and agree with nursing staff's documentation of this patient's encounter.  Desmin Daleo, PA-C 07/17/2018 4:04 PM    

## 2018-07-17 NOTE — Progress Notes (Signed)
Patient presents to office today for UPT. UPT +. Patient reports first positive home test around July 30. LMP 05/11/18 EDD 02/15/19 5988w4d. Patient denies taking any meds or vitamins. Patient would like Rx for PNV. PNV Rx sent to pharmacy & recommended she begin OB care. Patient verbalized understanding & had no questions.

## 2018-08-08 ENCOUNTER — Ambulatory Visit (INDEPENDENT_AMBULATORY_CARE_PROVIDER_SITE_OTHER): Payer: Medicaid Other | Admitting: Obstetrics and Gynecology

## 2018-08-08 ENCOUNTER — Encounter: Payer: Self-pay | Admitting: Obstetrics and Gynecology

## 2018-08-08 ENCOUNTER — Ambulatory Visit: Payer: Self-pay

## 2018-08-08 ENCOUNTER — Ambulatory Visit: Payer: Self-pay | Admitting: Clinical

## 2018-08-08 VITALS — BP 121/71 | HR 85 | Wt 252.2 lb

## 2018-08-08 DIAGNOSIS — O09899 Supervision of other high risk pregnancies, unspecified trimester: Secondary | ICD-10-CM | POA: Insufficient documentation

## 2018-08-08 DIAGNOSIS — O0990 Supervision of high risk pregnancy, unspecified, unspecified trimester: Secondary | ICD-10-CM | POA: Diagnosis not present

## 2018-08-08 DIAGNOSIS — Z8759 Personal history of other complications of pregnancy, childbirth and the puerperium: Secondary | ICD-10-CM

## 2018-08-08 DIAGNOSIS — Z6841 Body Mass Index (BMI) 40.0 and over, adult: Secondary | ICD-10-CM | POA: Insufficient documentation

## 2018-08-08 DIAGNOSIS — O3680X0 Pregnancy with inconclusive fetal viability, not applicable or unspecified: Secondary | ICD-10-CM

## 2018-08-08 DIAGNOSIS — O0992 Supervision of high risk pregnancy, unspecified, second trimester: Secondary | ICD-10-CM

## 2018-08-08 DIAGNOSIS — Z3490 Encounter for supervision of normal pregnancy, unspecified, unspecified trimester: Secondary | ICD-10-CM

## 2018-08-08 LAB — POCT URINALYSIS DIP (DEVICE)
Bilirubin Urine: NEGATIVE
GLUCOSE, UA: NEGATIVE mg/dL
Hgb urine dipstick: NEGATIVE
Ketones, ur: NEGATIVE mg/dL
Nitrite: NEGATIVE
PROTEIN: NEGATIVE mg/dL
Specific Gravity, Urine: 1.02 (ref 1.005–1.030)
UROBILINOGEN UA: 0.2 mg/dL (ref 0.0–1.0)
pH: 7 (ref 5.0–8.0)

## 2018-08-08 MED ORDER — ASPIRIN EC 81 MG PO TBEC: 81.0000 mg | DELAYED_RELEASE_TABLET | Freq: Every day | ORAL | 10 refills | Status: DC

## 2018-08-08 NOTE — Progress Notes (Signed)
Pt informed that the ultrasound is considered a limited OB ultrasound and is not intended to be a complete ultrasound exam.  Patient also informed that the ultrasound is not being completed with the intent of assessing for fetal or placental anomalies or any pelvic abnormalities.  Explained that the purpose of today's ultrasound is to assess for viability and approximate gestational age.  Patient acknowledges the purpose of the exam and the limitations of the study.

## 2018-08-08 NOTE — BH Specialist Note (Signed)
Integrated Behavioral Health Initial Visit  MRN: [id number] Name: Jeanette [name]  Number of Integrated Behavioral Health Clinician visits:: 1/6 Session Start time: 9:07  Session End time: 9:12 Total time: 15 minutes  Type of Service: Integrated Behavioral Health- Individual/Family Interpretor:No. Interpretor Name and Language: n/a   Warm Hand Off Completed.       SUBJECTIVE: Jeanette [name] is a 22 y.o. female accompanied by n/a Patient was referred by Napoleon Bingharlie Pickens, MD for Initial OB introduction to integrated behavioral health services . Patient reports the following symptoms/concerns: Pt states no particular concerns today.  Duration of problem: n/a; Severity of problem: n/a  OBJECTIVE: Mood: Normal and Affect: Appropriate Risk of harm to self or others: No plan to harm self or others  LIFE CONTEXT: Family and Social: Pt has 46mo daughter School/Work: - Self-Care: - Life Changes: Current pregnancy   INTERVENTIONS: Standardized Assessments completed: GAD-7 and PHQ 9   ASSESSMENT: Patient currently experiencing Supervision of high risk pregnancy, antepartum.   Patient may benefit from Initial OB introduction to integrated behavioral health services .  PLAN: 1. Follow up with behavioral health clinician on : As needed 2. Behavioral recommendations:  -Continue taking prenatal vitamin daily, as recommended by medical provider 3. Referral(s): Integrated Behavioral Health Services (In Clinic) 4. "From scale of 1-10, how likely are you to follow plan?": 10  Rae LipsJamie C McMannes, LCSW  Depression screen [facility] 2/9 08/08/2018 01/29/2018 10/02/2017 09/06/2017 07/31/2017  Decreased Interest 0 0 0 0 0  Down, Depressed, Hopeless 0 0 0 0 0  PHQ - 2 Score 0 0 0 0 0  Altered sleeping 0 0 0 0 0  Tired, decreased energy 0 0 0 2 0  Change in appetite 0 0 0 0 0  Feeling bad or failure about yourself  0 0 0 0 0  Trouble concentrating 0 0 0 0 0  Moving slowly or fidgety/restless 0 0  0 0 0  Suicidal thoughts 0 0 0 0 0  PHQ-9 Score 0 0 0 2 0   GAD 7 : Generalized Anxiety Score 08/08/2018 01/29/2018 10/02/2017 09/06/2017  Nervous, Anxious, on Edge 0 0 0 0  Control/stop worrying 0 0 0 0  Worry too much - different things 0 0 0 0  Trouble relaxing 0 0 0 0  Restless - 0 0 0  Easily annoyed or irritable 0 1 0 0  Afraid - awful might happen 0 0 0 0  Total GAD 7 Score - 1 0 0

## 2018-08-08 NOTE — Progress Notes (Signed)
New OB Note  08/08/2018   [facility]  Chief Complaint: NOB  Transfer of Care Patient: no  History of Present Illness: [name] is a 22 y.o. G2P1001 @ 12/5 weeks (EDC 3/27, based on Patient's last menstrual period was 05/11/2018 (exact date).=12wk u/s).  Preg complicated by has Supervision of high risk pregnancy, antepartum; Obesity affecting pregnancy, antepartum; History of gestational hypertension; History of shoulder dystocia in prior pregnancy; Short interval between pregnancies affecting pregnancy, antepartum; Supervision of low-risk pregnancy; and BMI 45.0-49.9, adult (HCC) on their problem list.   Any events prior to today's visit: no Her periods were: qmonth, regular She was using no method when she conceived.  She has mild signs or symptoms of nausea/vomiting of pregnancy. She has Negative signs or symptoms of miscarriage or preterm labor On any medications around the time she conceived/early pregnancy: No   ROS: A 12-point review of systems was performed and negative, except as stated in the above HPI.  OBGYN History: As per HPI. OB History  Gravida Para Term Preterm AB Living  2 1 1  0 0 1  SAB TAB Ectopic Multiple Live Births  0 0 0 0 1    # Outcome Date GA Lbr Len/2nd Weight Sex Delivery Anes PTL Lv  2 Current           1 Term 03/11/18 8374w1d  7 lb 6.5 oz (3.359 kg) F Vag-Spont EPI  LIV    Any issues with any prior pregnancies: gHTN noted during labor admission Prior children are healthy, doing well, and without any problems or issues: yes History of pap smears: No.    Past Medical History: Past Medical History:  Diagnosis Date  . Obesity     Past Surgical History: Past Surgical History:  Procedure Laterality Date  . NO PAST SURGERIES      Family History:  History reviewed. No pertinent family history. She denies any history of mental retardation, birth defects or genetic disorders in her or the FOB's history  Social  History:  Social History   Socioeconomic History  . Marital status: Significant Other    Spouse name: Not on file  . Number of children: 1  . Years of education: Not on file  . Highest education level: Not on file  Occupational History  . Not on file  Social Needs  . Financial resource strain: Not hard at all  . Food insecurity:    Worry: Never true    Inability: Never true  . Transportation needs:    Medical: No    Non-medical: No  Tobacco Use  . Smoking status: Former Smoker    Last attempt to quit: 07/15/2017    Years since quitting: 1.0  . Smokeless tobacco: Never Used  Substance and Sexual Activity  . Alcohol use: No    Frequency: Never  . Drug use: Not Currently    Types: Marijuana    Comment: stopped 09/01//2019  . Sexual activity: Yes    Birth control/protection: None  Lifestyle  . Physical activity:    Days per week: Not on file    Minutes per session: Not on file  . Stress: Not on file  Relationships  . Social connections:    Talks on phone: Not on file    Gets together: Not on file    Attends religious service: Not on file    Active member of club or organization: Not on file    Attends meetings of clubs or organizations: Not  on file    Relationship status: Not on file  . Intimate partner violence:    Fear of current or ex partner: No    Emotionally abused: No    Physically abused: No    Forced sexual activity: No  Other Topics Concern  . Not on file  Social History Narrative  . Not on file   Works at a call center  Allergy: No Known Allergies  Health Maintenance:  Mammogram Up to Date: not applicable  Current Outpatient Medications: PNV  Physical Exam:   BP 121/71   Pulse 85   Wt 252 lb 3.2 oz (114.4 kg)   LMP 05/11/2018 (Exact Date)   BMI 44.68 kg/m  Body mass index is 44.68 kg/m. Contractions: Not present Vag. Bleeding: None. Fundal height: not applicable FHTs: 160s  General appearance: Well nourished, well developed female  in no acute distress.  Cardiovascular: S1, S2 normal, no murmur, rub or gallop, regular rate and rhythm Respiratory:  Clear to auscultation bilateral. Normal respiratory effort Abdomen: positive bowel sounds and no masses, hernias; diffusely non tender to palpation, non distended Breasts: pt declines any breast s/s Neuro/Psych:  Normal mood and affect.  Skin:  Warm and dry.   Laboratory: none  Imaging:  U/s with SLIUP at 12/0 by CRL  Assessment: pt stable  Plan: 1. Encounter for supervision of high-risk pregnancy, antepartum Pt states she needs to apply for medicaid. Stressed importance of this and can do labs, pap, etc nv once she has medicaid  2. BMI 45.0-49.9, adult (HCC) Pt has already gained about 20lbs. Stressed to importance of watching her weight. Early a1c nv  3. H/o gestational HTN Recommend she start low dose asa today Baseline tsh, pre-x labs nv  4. H/o shoulder dystocia Pt states child is doing well and no sequelae and never needed PT. Can d/w pt more later in pregnancy. D/w her that I recommend u/s close to [facility]  Problem list reviewed and updated.  Follow up in 2 weeks.  The nature of [facility] with multiple MDs and other Advanced Practice Providers was explained to patient; also emphasized that residents, students are part of our team.  >50% of 20 min visit spent on counseling and coordination of care.     Cornelia Copa MD Attending Center for [facility])

## 2018-08-21 ENCOUNTER — Other Ambulatory Visit: Payer: Self-pay | Admitting: *Deleted

## 2018-08-21 DIAGNOSIS — O0990 Supervision of high risk pregnancy, unspecified, unspecified trimester: Secondary | ICD-10-CM

## 2018-08-22 ENCOUNTER — Other Ambulatory Visit (HOSPITAL_COMMUNITY)
Admission: RE | Admit: 2018-08-22 | Discharge: 2018-08-22 | Disposition: A | Discharge status: Home / Self Care | Payer: Medicaid Other | Source: Ambulatory Visit | Attending: Obstetrics and Gynecology | Admitting: Obstetrics and Gynecology

## 2018-08-22 ENCOUNTER — Other Ambulatory Visit: Payer: Medicaid Other

## 2018-08-22 ENCOUNTER — Ambulatory Visit (INDEPENDENT_AMBULATORY_CARE_PROVIDER_SITE_OTHER): Payer: Medicaid Other | Admitting: Obstetrics and Gynecology

## 2018-08-22 VITALS — BP 125/72 | HR 80 | Wt 252.0 lb

## 2018-08-22 DIAGNOSIS — E669 Obesity, unspecified: Secondary | ICD-10-CM | POA: Diagnosis not present

## 2018-08-22 DIAGNOSIS — Z23 Encounter for immunization: Secondary | ICD-10-CM

## 2018-08-22 DIAGNOSIS — O0990 Supervision of high risk pregnancy, unspecified, unspecified trimester: Secondary | ICD-10-CM

## 2018-08-22 DIAGNOSIS — Z3A14 14 weeks gestation of pregnancy: Secondary | ICD-10-CM | POA: Diagnosis not present

## 2018-08-22 DIAGNOSIS — O0992 Supervision of high risk pregnancy, unspecified, second trimester: Secondary | ICD-10-CM | POA: Diagnosis not present

## 2018-08-22 DIAGNOSIS — O99212 Obesity complicating pregnancy, second trimester: Secondary | ICD-10-CM

## 2018-08-22 DIAGNOSIS — Z8759 Personal history of other complications of pregnancy, childbirth and the puerperium: Secondary | ICD-10-CM | POA: Diagnosis not present

## 2018-08-22 DIAGNOSIS — O99210 Obesity complicating pregnancy, unspecified trimester: Secondary | ICD-10-CM

## 2018-08-22 DIAGNOSIS — O09899 Supervision of other high risk pregnancies, unspecified trimester: Secondary | ICD-10-CM

## 2018-08-22 NOTE — Progress Notes (Signed)
Prenatal Visit Note Date: 08/22/2018 [facility]  Subjective:  Jeanette [name] is a 22 y.o. G2P1001 at [redacted]w[redacted]d being seen today for ongoing prenatal care.  She is currently monitored for the following issues for this high-risk pregnancy and has Supervision of high risk pregnancy, antepartum; Obesity affecting pregnancy, antepartum; History of gestational hypertension; History of shoulder dystocia in prior pregnancy; Short interval between pregnancies affecting pregnancy, antepartum; Supervision of low-risk pregnancy; and BMI 45.0-49.9, adult (HCC) on their problem list.  Patient reports no complaints.   Contractions: Not present. Vag. Bleeding: None.  Movement: Absent. Denies leaking of fluid.   The following portions of the patient's history were reviewed and updated as appropriate: allergies, current medications, past family history, past medical history, past social history, past surgical history and problem list. Problem list updated.  Objective:   Vitals:   08/22/18 1541  BP: 125/72  Pulse: 80  Weight: 252 lb (114.3 kg)    Fetal Status: Fetal Heart Rate (bpm): 142   Movement: Absent     General:  Alert, oriented and cooperative. Patient is in no acute distress.  Skin: Skin is warm and dry. No rash noted.   Cardiovascular: Normal heart rate noted  Respiratory: Normal respiratory effort, no problems with respiration noted  Abdomen: Soft, gravid, appropriate for gestational age. Pain/Pressure: Absent     Pelvic:  Cervical exam performed Dilation: Closed Effacement (%): Thick  visually closed  Extremities: Normal range of motion.  Edema: None  Mental Status: Normal mood and affect. Normal behavior. Normal judgment and thought content.   Urinalysis:      Assessment and Plan:  Pregnancy: G2P1001 at [redacted]w[redacted]d  1. Supervision of high risk pregnancy, antepartum Routine care. Has medicaid now so will do NOB labs, pt amenable to cffdna. Anatomy u/s already  scheduled. Offer afp nv.  - Flu Vaccine QUAD 36+ mos IM - Cytology - PAP  2. Obesity affecting pregnancy, antepartum Weight stable  3. History of gestational hypertension Pt told okay to start low dose asa today  4. History of shoulder dystocia in prior pregnancy  5. Short interval between pregnancies affecting pregnancy, antepartum  Preterm labor symptoms and general obstetric precautions including but not limited to vaginal bleeding, contractions, leaking of fluid and fetal movement were reviewed in detail with the patient. Please refer to After Visit Summary for other counseling recommendations.  Return in about 2 weeks (around 09/05/2018) for 2-3wk hrob.   Selah Bing, MD

## 2018-08-23 LAB — CMP AND LIVER
ALBUMIN: 3.8 g/dL (ref 3.5–5.5)
ALT: 8 IU/L (ref 0–32)
AST: 10 IU/L (ref 0–40)
Alkaline Phosphatase: 64 IU/L (ref 39–117)
BUN: 9 mg/dL (ref 6–20)
Bilirubin Total: <0.2 mg/dL (ref 0.0–1.2)
Bilirubin, Direct: 0.08 mg/dL (ref 0.00–0.40)
CALCIUM: 9.5 mg/dL (ref 8.7–10.2)
CHLORIDE: 100 mmol/L (ref 96–106)
CO2: 21 mmol/L (ref 20–29)
CREATININE: 0.47 mg/dL — AB (ref 0.57–1.00)
GFR, EST AFRICAN AMERICAN: 163 mL/min/{1.73_m2} (ref >59)
GFR, EST NON AFRICAN AMERICAN: 142 mL/min/{1.73_m2} (ref >59)
GLUCOSE: 73 mg/dL (ref 65–99)
Potassium: 3.9 mmol/L (ref 3.5–5.2)
Sodium: 135 mmol/L (ref 134–144)
TOTAL PROTEIN: 6.6 g/dL (ref 6.0–8.5)

## 2018-08-23 LAB — OBSTETRIC PANEL, INCLUDING HIV
ANTIBODY SCREEN: NEGATIVE
BASOS: 0 %
Basophils Absolute: 0 10*3/uL (ref 0.0–0.2)
EOS (ABSOLUTE): 0.1 10*3/uL (ref 0.0–0.4)
EOS: 1 %
HEMATOCRIT: 36.5 % (ref 34.0–46.6)
HEMOGLOBIN: 12.3 g/dL (ref 11.1–15.9)
HIV Screen 4th Generation wRfx: NONREACTIVE
Hepatitis B Surface Ag: NEGATIVE
IMMATURE GRANS (ABS): 0 10*3/uL (ref 0.0–0.1)
Immature Granulocytes: 0 %
Lymphocytes Absolute: 2.6 10*3/uL (ref 0.7–3.1)
Lymphs: 30 %
MCH: 28.7 pg (ref 26.6–33.0)
MCHC: 33.7 g/dL (ref 31.5–35.7)
MCV: 85 fL (ref 79–97)
MONOCYTES: 7 %
Monocytes Absolute: 0.6 10*3/uL (ref 0.1–0.9)
NEUTROS ABS: 5.3 10*3/uL (ref 1.4–7.0)
Neutrophils: 62 %
Platelets: 334 10*3/uL (ref 150–450)
RBC: 4.28 x10E6/uL (ref 3.77–5.28)
RDW: 14.8 % (ref 12.3–15.4)
RH TYPE: POSITIVE
RPR Ser Ql: NONREACTIVE
RUBELLA: 1.1 {index} (ref >0.99)
WBC: 8.6 10*3/uL (ref 3.4–10.8)

## 2018-08-23 LAB — HEMOGLOBIN A1C
ESTIMATED AVERAGE GLUCOSE: 105 mg/dL
Hgb A1c MFr Bld: 5.3 % (ref 4.8–5.6)

## 2018-08-23 LAB — CYTOLOGY - PAP
CHLAMYDIA, DNA PROBE: NEGATIVE
Diagnosis: NEGATIVE
NEISSERIA GONORRHEA: NEGATIVE
TRICH (WINDOWPATH): NEGATIVE

## 2018-08-23 LAB — PROTEIN / CREATININE RATIO, URINE
CREATININE, UR: 130.6 mg/dL
Protein, Ur: 9.6 mg/dL
Protein/Creat Ratio: 74 mg/g creat (ref 0–200)

## 2018-08-23 LAB — TSH: TSH: 0.338 u[IU]/mL — AB (ref 0.450–4.500)

## 2018-08-24 LAB — CULTURE, OB URINE

## 2018-08-24 LAB — URINE CULTURE, OB REFLEX

## 2018-08-28 ENCOUNTER — Encounter: Payer: Self-pay | Admitting: Obstetrics and Gynecology

## 2018-08-28 DIAGNOSIS — R7989 Other specified abnormal findings of blood chemistry: Secondary | ICD-10-CM | POA: Insufficient documentation

## 2018-08-30 ENCOUNTER — Encounter: Payer: Self-pay | Admitting: *Deleted

## 2018-09-04 ENCOUNTER — Encounter: Payer: Self-pay | Admitting: *Deleted

## 2018-09-11 ENCOUNTER — Encounter: Payer: Self-pay | Admitting: Obstetrics & Gynecology

## 2018-09-11 ENCOUNTER — Encounter (HOSPITAL_COMMUNITY): Payer: Self-pay

## 2018-09-11 ENCOUNTER — Ambulatory Visit (INDEPENDENT_AMBULATORY_CARE_PROVIDER_SITE_OTHER): Payer: Medicaid Other | Admitting: Obstetrics & Gynecology

## 2018-09-11 VITALS — BP 137/73 | HR 96 | Wt 251.0 lb

## 2018-09-11 DIAGNOSIS — R7989 Other specified abnormal findings of blood chemistry: Secondary | ICD-10-CM

## 2018-09-11 DIAGNOSIS — Z8759 Personal history of other complications of pregnancy, childbirth and the puerperium: Secondary | ICD-10-CM

## 2018-09-11 DIAGNOSIS — O09899 Supervision of other high risk pregnancies, unspecified trimester: Secondary | ICD-10-CM

## 2018-09-11 DIAGNOSIS — Z6841 Body Mass Index (BMI) 40.0 and over, adult: Secondary | ICD-10-CM

## 2018-09-11 DIAGNOSIS — O99210 Obesity complicating pregnancy, unspecified trimester: Secondary | ICD-10-CM

## 2018-09-11 DIAGNOSIS — O0990 Supervision of high risk pregnancy, unspecified, unspecified trimester: Secondary | ICD-10-CM | POA: Diagnosis not present

## 2018-09-11 NOTE — Patient Instructions (Signed)
Second Trimester of Pregnancy The second trimester is from week 13 through week 28, month 4 through 6. This is often the time in pregnancy that you feel your best. Often times, morning sickness has lessened or quit. You may have more energy, and you may get hungry more often. Your unborn baby (fetus) is growing rapidly. At the end of the sixth month, he or she is about 9 inches long and weighs about 1 pounds. You will likely feel the baby move (quickening) between 18 and 20 weeks of pregnancy. Follow these instructions at home:  Avoid all smoking, herbs, and alcohol. Avoid drugs not approved by your doctor.  Do not use any tobacco products, including cigarettes, chewing tobacco, and electronic cigarettes. If you need help quitting, ask your doctor. You may get counseling or other support to help you quit.  Only take medicine as told by your doctor. Some medicines are safe and some are not during pregnancy.  Exercise only as told by your doctor. Stop exercising if you start having cramps.  Eat regular, healthy meals.  Wear a good support bra if your breasts are tender.  Do not use hot tubs, steam rooms, or saunas.  Wear your seat belt when driving.  Avoid raw meat, uncooked cheese, and liter boxes and soil used by cats.  Take your prenatal vitamins.  Take 1500-2000 milligrams of calcium daily starting at the 20th week of pregnancy until you deliver your baby.  Try taking medicine that helps you poop (stool softener) as needed, and if your doctor approves. Eat more fiber by eating fresh fruit, vegetables, and whole grains. Drink enough fluids to keep your pee (urine) clear or pale yellow.  Take warm water baths (sitz baths) to soothe pain or discomfort caused by hemorrhoids. Use hemorrhoid cream if your doctor approves.  If you have puffy, bulging veins (varicose veins), wear support hose. Raise (elevate) your feet for 15 minutes, 3-4 times a day. Limit salt in your diet.  Avoid heavy  lifting, wear low heals, and sit up straight.  Rest with your legs raised if you have leg cramps or low back pain.  Visit your dentist if you have not gone during your pregnancy. Use a soft toothbrush to brush your teeth. Be gentle when you floss.  You can have sex (intercourse) unless your doctor tells you not to.  Go to your doctor visits. Get help if:  You feel dizzy.  You have mild cramps or pressure in your lower belly (abdomen).  You have a nagging pain in your belly area.  You continue to feel sick to your stomach (nauseous), throw up (vomit), or have watery poop (diarrhea).  You have bad smelling fluid coming from your vagina.  You have pain with peeing (urination). Get help right away if:  You have a fever.  You are leaking fluid from your vagina.  You have spotting or bleeding from your vagina.  You have severe belly cramping or pain.  You lose or gain weight rapidly.  You have trouble catching your breath and have chest pain.  You notice sudden or extreme puffiness (swelling) of your face, hands, ankles, feet, or legs.  You have not felt the baby move in over an hour.  You have severe headaches that do not go away with medicine.  You have vision changes. This information is not intended to replace advice given to you by your health care provider. Make sure you discuss any questions you have with your health care   provider. Document Released: 02/01/2010 Document Revised: 04/14/2016 Document Reviewed: 01/08/2013 Elsevier Interactive Patient Education  2017 [employer]  

## 2018-09-11 NOTE — Progress Notes (Signed)
   PRENATAL VISIT NOTE  Subjective:  Jeanette [name] is a 22 y.o. G2P1001 at [redacted]w[redacted]d being seen today for ongoing prenatal care.  She is currently monitored for the following issues for this high-risk pregnancy and has Supervision of high risk pregnancy, antepartum; Obesity affecting pregnancy, antepartum; History of gestational hypertension; History of shoulder dystocia in prior pregnancy; Short interval between pregnancies affecting pregnancy, antepartum; Supervision of low-risk pregnancy; BMI 45.0-49.9, adult (HCC); and Low TSH level on their problem list.  Patient reports no complaints. Denies leaking of fluid. No vaginal bleeding or abd pain.   The following portions of the patient's history were reviewed and updated as appropriate: allergies, current medications, past family history, past medical history, past social history, past surgical history and problem list. Problem list updated.  Objective:  BP 137/73   Pulse 96   Wt 251 lb (113.9 kg)   LMP 05/11/2018 (Exact Date)   BMI 44.46 kg/m    Fetal Status:           General:  Alert, oriented and cooperative. Patient is in no acute distress.  Skin: Skin is warm and dry. No rash noted.   Cardiovascular: Normal heart rate noted  Respiratory: Normal respiratory effort, no problems with respiration noted  Abdomen: Soft, gravid, appropriate for gestational age.        Pelvic: Cervical exam deferred        Extremities: Normal range of motion.     Mental Status: Normal mood and affect. Normal behavior. Normal judgment and thought content.   Assessment and Plan:  Pregnancy: G2P1001 at [redacted]w[redacted]d  1. Supervision of high risk pregnancy, antepartum AFP today Anatomy scan 10/29 2. Short interval between pregnancies affecting pregnancy, antepartum  3. Obesity affecting pregnancy, antepartum  4. Low TSH level fT4 and T3 today  5. History of shoulder dystocia in prior pregnancy  6. History of gestational hypertension needs baby ASA  daily.    7. BMI 45.0-49.9, adult (HCC) Give shape of abd. May need [place] q 4 weeks to assess growth.   Preterm labor symptoms and general obstetric precautions including but not limited to vaginal bleeding, contractions, leaking of fluid and fetal movement were reviewed in detail with the patient. Please refer to After Visit Summary for other counseling recommendations.  Return in about 4 weeks (around 10/09/2018).  Future Appointments  Date Time Provider Department Center  09/18/2018 10:15 AM [facility] [place] 4 [facility]    Willodean Rosenthal, MD

## 2018-09-13 LAB — AFP, SERUM, OPEN SPINA BIFIDA
AFP MoM: 1.13
AFP Value: 37.5 ng/mL
GEST. AGE ON COLLECTION DATE: 17.6 wk
MATERNAL AGE AT EDD: 22 a
OSBR Risk 1 IN: 10000
TEST RESULTS AFP: NEGATIVE
WEIGHT: 251 [lb_av]

## 2018-09-13 LAB — T4, FREE: FREE T4: 1.28 ng/dL (ref 0.82–1.77)

## 2018-09-13 LAB — T3: T3 TOTAL: 230 ng/dL — AB (ref 71–180)

## 2018-09-15 LAB — T4, FREE: Free T4: 1.48 ng/dL (ref 0.82–1.77)

## 2018-09-15 LAB — SPECIMEN STATUS REPORT

## 2018-09-18 ENCOUNTER — Ambulatory Visit (HOSPITAL_COMMUNITY)
Admission: RE | Admit: 2018-09-18 | Discharge: 2018-09-18 | Disposition: A | Discharge status: Home / Self Care | Payer: Medicaid Other | Source: Ambulatory Visit | Attending: Obstetrics and Gynecology | Admitting: Obstetrics and Gynecology

## 2018-09-18 ENCOUNTER — Other Ambulatory Visit: Payer: Self-pay | Admitting: Obstetrics and Gynecology

## 2018-09-18 ENCOUNTER — Other Ambulatory Visit (HOSPITAL_COMMUNITY): Payer: Self-pay | Admitting: *Deleted

## 2018-09-18 DIAGNOSIS — Z3A18 18 weeks gestation of pregnancy: Secondary | ICD-10-CM | POA: Insufficient documentation

## 2018-09-18 DIAGNOSIS — O0990 Supervision of high risk pregnancy, unspecified, unspecified trimester: Secondary | ICD-10-CM

## 2018-09-18 DIAGNOSIS — O99212 Obesity complicating pregnancy, second trimester: Secondary | ICD-10-CM | POA: Diagnosis not present

## 2018-09-18 DIAGNOSIS — Z363 Encounter for antenatal screening for malformations: Secondary | ICD-10-CM | POA: Diagnosis not present

## 2018-09-18 DIAGNOSIS — O0992 Supervision of high risk pregnancy, unspecified, second trimester: Secondary | ICD-10-CM | POA: Insufficient documentation

## 2018-09-18 DIAGNOSIS — Z362 Encounter for other antenatal screening follow-up: Secondary | ICD-10-CM

## 2018-09-18 DIAGNOSIS — O09292 Supervision of pregnancy with other poor reproductive or obstetric history, second trimester: Secondary | ICD-10-CM

## 2018-10-12 ENCOUNTER — Ambulatory Visit (INDEPENDENT_AMBULATORY_CARE_PROVIDER_SITE_OTHER): Payer: Medicaid Other | Admitting: Obstetrics and Gynecology

## 2018-10-12 VITALS — BP 110/56 | HR 92 | Wt 258.6 lb

## 2018-10-12 DIAGNOSIS — Z8759 Personal history of other complications of pregnancy, childbirth and the puerperium: Secondary | ICD-10-CM

## 2018-10-12 DIAGNOSIS — O0992 Supervision of high risk pregnancy, unspecified, second trimester: Secondary | ICD-10-CM

## 2018-10-12 DIAGNOSIS — O0990 Supervision of high risk pregnancy, unspecified, unspecified trimester: Secondary | ICD-10-CM

## 2018-10-12 DIAGNOSIS — R7989 Other specified abnormal findings of blood chemistry: Secondary | ICD-10-CM

## 2018-10-12 DIAGNOSIS — O99210 Obesity complicating pregnancy, unspecified trimester: Secondary | ICD-10-CM

## 2018-10-12 DIAGNOSIS — O09899 Supervision of other high risk pregnancies, unspecified trimester: Secondary | ICD-10-CM

## 2018-10-12 DIAGNOSIS — O09892 Supervision of other high risk pregnancies, second trimester: Secondary | ICD-10-CM

## 2018-10-12 DIAGNOSIS — Z6841 Body Mass Index (BMI) 40.0 and over, adult: Secondary | ICD-10-CM

## 2018-10-12 DIAGNOSIS — Z3A22 22 weeks gestation of pregnancy: Secondary | ICD-10-CM

## 2018-10-12 NOTE — Progress Notes (Signed)
Prenatal Visit Note Date: 10/12/2018 Clinic: [facility]  Subjective:  Jeanette [name] is a 22 y.o. G2P1001 at 9348w0d being seen today for ongoing prenatal care.  She is currently monitored for the following issues for this high-risk pregnancy and has Supervision of high risk pregnancy, antepartum; Obesity affecting pregnancy, antepartum; History of gestational hypertension; History of shoulder dystocia in prior pregnancy; Short interval between pregnancies affecting pregnancy, antepartum; BMI 45.0-49.9, adult (HCC); and Low TSH level on their problem list.  Patient reports no complaints.   Contractions: Irritability. Vag. Bleeding: None.  Movement: Present. Denies leaking of fluid.   The following portions of the patient's history were reviewed and updated as appropriate: allergies, current medications, past family history, past medical history, past social history, past surgical history and problem list. Problem list updated.  Objective:   Vitals:   10/12/18 0834  BP: (!) 110/56  Pulse: 92  Weight: 258 lb 9.6 oz (117.3 kg)    Fetal Status: Fetal Heart Rate (bpm): 130   Movement: Present     General:  Alert, oriented and cooperative. Patient is in no acute distress.  Skin: Skin is warm and dry. No rash noted.   Cardiovascular: Normal heart rate noted  Respiratory: Normal respiratory effort, no problems with respiration noted  Abdomen: Soft, gravid, appropriate for gestational age. Pain/Pressure: Present     Pelvic:  Cervical exam deferred        Extremities: Normal range of motion.  Edema: None  Mental Status: Normal mood and affect. Normal behavior. Normal judgment and thought content.   Urinalysis:      Assessment and Plan:  Pregnancy: G2P1001 at 2148w0d  1. Supervision of high risk pregnancy, antepartum Routine care. 2hr GTT nv - CBC; Future - RPR; Future - HIV antibody (with reflex); Future - T4, free; Future - Glucose Tolerance, 2 Hours w/1 Hour;  Future  2. Short interval between pregnancies affecting pregnancy, antepartum  3. Obesity affecting pregnancy, antepartum  4. BMI 45.0-49.9, adult (HCC) D/w her re: weight gain and try for goal in the high 260s by the end of pregnancy  5. Low TSH level Recheck nv - T4, free; Future  6. History of shoulder dystocia in prior pregnancy D/w pt more in early third trimester  7. History of gestational hypertension Confirms low dose ASA  Preterm labor symptoms and general obstetric precautions including but not limited to vaginal bleeding, contractions, leaking of fluid and fetal movement were reviewed in detail with the patient. Please refer to After Visit Summary for other counseling recommendations.  Return in about 4 weeks (around 11/09/2018) for rob.   Lismore BingPickens, Macalister Arnaud, MD

## 2018-10-16 ENCOUNTER — Ambulatory Visit (HOSPITAL_COMMUNITY)
Admission: RE | Admit: 2018-10-16 | Discharge: 2018-10-16 | Disposition: A | Discharge status: Home / Self Care | Payer: Medicaid Other | Source: Ambulatory Visit | Attending: Obstetrics and Gynecology | Admitting: Obstetrics and Gynecology

## 2018-10-16 DIAGNOSIS — Z3A22 22 weeks gestation of pregnancy: Secondary | ICD-10-CM | POA: Diagnosis not present

## 2018-10-16 DIAGNOSIS — Z362 Encounter for other antenatal screening follow-up: Secondary | ICD-10-CM | POA: Insufficient documentation

## 2018-10-16 DIAGNOSIS — O99212 Obesity complicating pregnancy, second trimester: Secondary | ICD-10-CM | POA: Diagnosis not present

## 2018-10-16 DIAGNOSIS — O09892 Supervision of other high risk pregnancies, second trimester: Secondary | ICD-10-CM | POA: Insufficient documentation

## 2018-10-16 DIAGNOSIS — E669 Obesity, unspecified: Secondary | ICD-10-CM | POA: Diagnosis not present

## 2018-10-16 DIAGNOSIS — O09292 Supervision of pregnancy with other poor reproductive or obstetric history, second trimester: Secondary | ICD-10-CM | POA: Diagnosis not present

## 2018-10-16 DIAGNOSIS — Z8632 Personal history of gestational diabetes: Secondary | ICD-10-CM | POA: Diagnosis not present

## 2018-11-09 ENCOUNTER — Other Ambulatory Visit: Payer: Medicaid Other

## 2018-11-09 ENCOUNTER — Ambulatory Visit (INDEPENDENT_AMBULATORY_CARE_PROVIDER_SITE_OTHER): Payer: Medicaid Other | Admitting: Obstetrics & Gynecology

## 2018-11-09 VITALS — BP 129/71 | HR 80 | Wt 265.5 lb

## 2018-11-09 DIAGNOSIS — O99212 Obesity complicating pregnancy, second trimester: Secondary | ICD-10-CM | POA: Diagnosis not present

## 2018-11-09 DIAGNOSIS — O0990 Supervision of high risk pregnancy, unspecified, unspecified trimester: Secondary | ICD-10-CM

## 2018-11-09 DIAGNOSIS — Z23 Encounter for immunization: Secondary | ICD-10-CM | POA: Diagnosis not present

## 2018-11-09 DIAGNOSIS — R7989 Other specified abnormal findings of blood chemistry: Secondary | ICD-10-CM | POA: Diagnosis not present

## 2018-11-09 DIAGNOSIS — Z8759 Personal history of other complications of pregnancy, childbirth and the puerperium: Secondary | ICD-10-CM

## 2018-11-09 DIAGNOSIS — O2602 Excessive weight gain in pregnancy, second trimester: Secondary | ICD-10-CM | POA: Diagnosis not present

## 2018-11-09 DIAGNOSIS — O09892 Supervision of other high risk pregnancies, second trimester: Secondary | ICD-10-CM

## 2018-11-09 DIAGNOSIS — O99210 Obesity complicating pregnancy, unspecified trimester: Secondary | ICD-10-CM

## 2018-11-09 DIAGNOSIS — O0992 Supervision of high risk pregnancy, unspecified, second trimester: Secondary | ICD-10-CM

## 2018-11-09 DIAGNOSIS — O09899 Supervision of other high risk pregnancies, unspecified trimester: Secondary | ICD-10-CM

## 2018-11-09 NOTE — Progress Notes (Signed)
   PRENATAL VISIT NOTE  Subjective:  Jeanette [name] is a 22 y.o. G2P1001 at 6069w0d being seen today for ongoing prenatal care.  She is currently monitored for the following issues for this high-risk pregnancy and has Supervision of high risk pregnancy, antepartum; Obesity affecting pregnancy, antepartum; History of gestational hypertension; History of shoulder dystocia in prior pregnancy; Short interval between pregnancies affecting pregnancy, antepartum; BMI 45.0-49.9, adult (HCC); Low TSH level; and Excessive weight gain during pregnancy in second trimester on their problem list.  Patient reports no complaints.  Contractions: Not present. Vag. Bleeding: None.  Movement: Present. Denies leaking of fluid.   The following portions of the patient's history were reviewed and updated as appropriate: allergies, current medications, past family history, past medical history, past social history, past surgical history and problem list. Problem list updated.  Objective:   Vitals:   11/09/18 0824  BP: 129/71  Pulse: 80  Weight: 265 lb 8 oz (120.4 kg)    Fetal Status: Fetal Heart Rate (bpm): 135   Movement: Present     General:  Alert, oriented and cooperative. Patient is in no acute distress.  Skin: Skin is warm and dry. No rash noted.   Cardiovascular: Normal heart rate noted  Respiratory: Normal respiratory effort, no problems with respiration noted  Abdomen: Soft, gravid, appropriate for gestational age.  Pain/Pressure: Absent     Pelvic: Cervical exam deferred        Extremities: Normal range of motion.  Edema: None  Mental Status: Normal mood and affect. Normal behavior. Normal judgment and thought content.   Assessment and Plan:  Pregnancy: G2P1001 at 5169w0d  1. Supervision of high risk pregnancy, antepartum  - Tdap vaccine greater than or equal to 7yo IM - 28 week labs today  2. Short interval between pregnancies affecting pregnancy, antepartum   3. Obesity affecting pregnancy,  antepartum  - Referral to Nutrition and Diabetes Services  4. History of shoulder dystocia in prior pregnancy   5. Excessive weight gain during pregnancy in second trimester   Preterm labor symptoms and general obstetric precautions including but not limited to vaginal bleeding, contractions, leaking of fluid and fetal movement were reviewed in detail with the patient. Please refer to After Visit Summary for other counseling recommendations.  No follow-ups on file.  Future Appointments  Date Time Provider Department Center  11/09/2018  8:50 AM [facility]    Allie BossierMyra C Jasilyn Holderman, MD

## 2018-11-10 LAB — T4, FREE: Free T4: 1.1 ng/dL (ref 0.82–1.77)

## 2018-11-10 LAB — CBC
HEMOGLOBIN: 11.2 g/dL (ref 11.1–15.9)
Hematocrit: 33.8 % — ABNORMAL LOW (ref 34.0–46.6)
MCH: 28.6 pg (ref 26.6–33.0)
MCHC: 33.1 g/dL (ref 31.5–35.7)
MCV: 86 fL (ref 79–97)
Platelets: 341 10*3/uL (ref 150–450)
RBC: 3.91 x10E6/uL (ref 3.77–5.28)
RDW: 14.1 % (ref 12.3–15.4)
WBC: 10.1 10*3/uL (ref 3.4–10.8)

## 2018-11-10 LAB — RPR: RPR Ser Ql: NONREACTIVE

## 2018-11-10 LAB — GLUCOSE TOLERANCE, 2 HOURS W/ 1HR
GLUCOSE, 1 HOUR: 103 mg/dL (ref 65–179)
GLUCOSE, 2 HOUR: 121 mg/dL (ref 65–152)
Glucose, Fasting: 75 mg/dL (ref 65–91)

## 2018-11-10 LAB — HIV ANTIBODY (ROUTINE TESTING W REFLEX): HIV Screen 4th Generation wRfx: NONREACTIVE

## 2018-11-21 NOTE — L&D Delivery Note (Signed)
Delivery Note Lita Campana is a 23 y.o. G2P1001 at [redacted]w[redacted]d admitted for IOL for history of shoulder dystocia.  Labor course: uncomplicated ROM: 1h 65m with clear fluid  At 2315 a viable and healthy boy was delivered via spontaneous vaginal delivery (Presentation: cephalic; LOA  ).  Infant placed directly on mom's abdomen for bonding/skin-to-skin. Delayed cord clamping x , then cord clamped x 2, and cut by father.  APGAR: , ; weight: pending at time of note.  40 units of pitocin diluted in 1000cc LR was infused rapidly IV per protocol. The placenta separated spontaneously and delivered via CCT and maternal pushing effort.  It was inspected and appears to be intact with a 3 VC.  Placenta/Cord with the following complications: none.  Cord pH: n/a  Intrapartum complications:  None Anesthesia:  epidural Episiotomy: none Lacerations:  none Suture Repair: n/a Est. Blood Loss (mL): 50 Sponge and instrument count were correct x2.  Mom to postpartum.  Baby to Couplet care / Skin to Skin. Placenta to L&D. Plans to formula feed Contraception: IUD Circ: undecided  Brand Males, [facility] 02/08/2019 11:33 PM

## 2018-12-06 ENCOUNTER — Ambulatory Visit (INDEPENDENT_AMBULATORY_CARE_PROVIDER_SITE_OTHER): Payer: Medicaid Other | Admitting: Obstetrics & Gynecology

## 2018-12-06 VITALS — BP 134/80 | HR 108 | Wt 264.0 lb

## 2018-12-06 DIAGNOSIS — O0990 Supervision of high risk pregnancy, unspecified, unspecified trimester: Secondary | ICD-10-CM

## 2018-12-06 DIAGNOSIS — Z8759 Personal history of other complications of pregnancy, childbirth and the puerperium: Secondary | ICD-10-CM

## 2018-12-06 DIAGNOSIS — O99213 Obesity complicating pregnancy, third trimester: Secondary | ICD-10-CM

## 2018-12-06 DIAGNOSIS — Z3A29 29 weeks gestation of pregnancy: Secondary | ICD-10-CM

## 2018-12-06 DIAGNOSIS — O0993 Supervision of high risk pregnancy, unspecified, third trimester: Secondary | ICD-10-CM

## 2018-12-06 DIAGNOSIS — O99210 Obesity complicating pregnancy, unspecified trimester: Secondary | ICD-10-CM

## 2018-12-06 NOTE — Progress Notes (Signed)
   PRENATAL VISIT NOTE  Subjective:  Jeanette [name] is a 23 y.o. G2P1001 at [redacted]w[redacted]d being seen today for ongoing prenatal care.  She is currently monitored for the following issues for this high-risk pregnancy and has Supervision of high risk pregnancy, antepartum; Obesity affecting pregnancy, antepartum; History of gestational hypertension; History of shoulder dystocia in prior pregnancy; Short interval between pregnancies affecting pregnancy, antepartum; BMI 45.0-49.9, adult (HCC); Low TSH level; and Excessive weight gain during pregnancy in second trimester on their problem list.  Patient reports backache.  Contractions: Not present. Vag. Bleeding: None.  Movement: Present. Denies leaking of fluid.   The following portions of the patient's history were reviewed and updated as appropriate: allergies, current medications, past family history, past medical history, past social history, past surgical history and problem list. Problem list updated.  Objective:   Vitals:   12/06/18 1336  BP: 134/80  Pulse: (!) 108  Weight: 264 lb (119.7 kg)    Fetal Status: Fetal Heart Rate (bpm): 125   Movement: Present     General:  Alert, oriented and cooperative. Patient is in no acute distress.  Skin: Skin is warm and dry. No rash noted.   Cardiovascular: Normal heart rate noted  Respiratory: Normal respiratory effort, no problems with respiration noted  Abdomen: Soft, gravid, appropriate for gestational age.  Pain/Pressure: Present     Pelvic: Cervical exam deferred        Extremities: Normal range of motion.  Edema: None  Mental Status: Normal mood and affect. Normal behavior. Normal judgment and thought content.   Assessment and Plan:  Pregnancy: G2P1001 at [redacted]w[redacted]d  1. Supervision of high risk pregnancy, antepartum - schedule MFM follow up u/s  2. Obesity affecting pregnancy, antepartum   3. History of shoulder dystocia in prior pregnancy   4. History of gestational hypertension - follow  BPs  Preterm labor symptoms and general obstetric precautions including but not limited to vaginal bleeding, contractions, leaking of fluid and fetal movement were reviewed in detail with the patient. Please refer to After Visit Summary for other counseling recommendations.  No follow-ups on file.  No future appointments.  Allie Bossier, MD

## 2018-12-06 NOTE — Progress Notes (Signed)
Follow up ultrasound scheduled for 1515 on January 21st.

## 2018-12-11 ENCOUNTER — Ambulatory Visit (HOSPITAL_COMMUNITY)
Admission: RE | Admit: 2018-12-11 | Discharge: 2018-12-11 | Disposition: A | Discharge status: Home / Self Care | Payer: Medicaid Other | Source: Ambulatory Visit | Attending: Obstetrics & Gynecology | Admitting: Obstetrics & Gynecology

## 2018-12-11 DIAGNOSIS — Z3A30 30 weeks gestation of pregnancy: Secondary | ICD-10-CM | POA: Diagnosis not present

## 2018-12-11 DIAGNOSIS — O09293 Supervision of pregnancy with other poor reproductive or obstetric history, third trimester: Secondary | ICD-10-CM | POA: Diagnosis not present

## 2018-12-11 DIAGNOSIS — O0990 Supervision of high risk pregnancy, unspecified, unspecified trimester: Secondary | ICD-10-CM

## 2018-12-11 DIAGNOSIS — Z362 Encounter for other antenatal screening follow-up: Secondary | ICD-10-CM | POA: Diagnosis not present

## 2018-12-11 DIAGNOSIS — O99213 Obesity complicating pregnancy, third trimester: Secondary | ICD-10-CM

## 2018-12-11 DIAGNOSIS — O09893 Supervision of other high risk pregnancies, third trimester: Secondary | ICD-10-CM | POA: Diagnosis not present

## 2018-12-12 ENCOUNTER — Other Ambulatory Visit (HOSPITAL_COMMUNITY): Payer: Self-pay | Admitting: *Deleted

## 2018-12-12 DIAGNOSIS — O99213 Obesity complicating pregnancy, third trimester: Secondary | ICD-10-CM

## 2018-12-20 ENCOUNTER — Encounter: Payer: Self-pay | Admitting: Obstetrics and Gynecology

## 2018-12-20 ENCOUNTER — Ambulatory Visit (INDEPENDENT_AMBULATORY_CARE_PROVIDER_SITE_OTHER): Payer: Medicaid Other | Admitting: Obstetrics and Gynecology

## 2018-12-20 VITALS — BP 126/76 | HR 96 | Wt 268.4 lb

## 2018-12-20 DIAGNOSIS — O409XX0 Polyhydramnios, unspecified trimester, not applicable or unspecified: Secondary | ICD-10-CM

## 2018-12-20 DIAGNOSIS — O0990 Supervision of high risk pregnancy, unspecified, unspecified trimester: Secondary | ICD-10-CM

## 2018-12-20 DIAGNOSIS — O403XX0 Polyhydramnios, third trimester, not applicable or unspecified: Secondary | ICD-10-CM

## 2018-12-20 DIAGNOSIS — O0993 Supervision of high risk pregnancy, unspecified, third trimester: Secondary | ICD-10-CM

## 2018-12-20 NOTE — Progress Notes (Signed)
Prenatal Visit Note Date: 12/20/2018 Clinic: [facility]  Subjective:  Jeanette [name] is a 23 y.o. G2P1001 at 1926w6d being seen today for ongoing prenatal care.  She is currently monitored for the following issues for this high-risk pregnancy and has Supervision of high risk pregnancy, antepartum; Obesity affecting pregnancy, antepartum; History of gestational hypertension; History of shoulder dystocia in prior pregnancy; Short interval between pregnancies affecting pregnancy, antepartum; BMI 45.0-49.9, adult (HCC); Low TSH level; Excessive weight gain during pregnancy in second trimester; and Polyhydramnios affecting pregnancy on their problem list.  Patient reports no complaints.   Contractions: Not present. Vag. Bleeding: None.  Movement: Present. Denies leaking of fluid.   The following portions of the patient's history were reviewed and updated as appropriate: allergies, current medications, past family history, past medical history, past social history, past surgical history and problem list. Problem list updated.  Objective:   Vitals:   12/20/18 1616  BP: 126/76  Pulse: 96  Weight: 268 lb 6.4 oz (121.7 kg)    Fetal Status: Fetal Heart Rate (bpm): 134   Movement: Present     General:  Alert, oriented and cooperative. Patient is in no acute distress.  Skin: Skin is warm and dry. No rash noted.   Cardiovascular: Normal heart rate noted  Respiratory: Normal respiratory effort, no problems with respiration noted  Abdomen: Soft, gravid, appropriate for gestational age. Pain/Pressure: Absent     Pelvic:  Cervical exam deferred        Extremities: Normal range of motion.  Edema: None  Mental Status: Normal mood and affect. Normal behavior. Normal judgment and thought content.   Urinalysis:      Assessment and Plan:  Pregnancy: G2P1001 at 826w6d  Routine care. Thinking about a LARC option. F/u u/s on 2/18  H/o shoulder dystocia. D/w her re: delivery plan nv  and at subsequent growth u/s  Preterm labor symptoms and general obstetric precautions including but not limited to vaginal bleeding, contractions, leaking of fluid and fetal movement were reviewed in detail with the patient. Please refer to After Visit Summary for other counseling recommendations.  Return in about 2 weeks (around 01/03/2019) for rob.   [place] BingPickens, Jeanette Oyervides, MD

## 2019-01-03 ENCOUNTER — Encounter: Payer: Self-pay | Admitting: Obstetrics & Gynecology

## 2019-01-08 ENCOUNTER — Other Ambulatory Visit: Payer: Self-pay | Admitting: Advanced Practice Midwife

## 2019-01-08 ENCOUNTER — Encounter (HOSPITAL_COMMUNITY): Payer: Self-pay | Admitting: *Deleted

## 2019-01-08 ENCOUNTER — Inpatient Hospital Stay (HOSPITAL_COMMUNITY)
Admission: AD | Admit: 2019-01-08 | Discharge: 2019-01-08 | Disposition: A | Discharge status: Home / Self Care | Payer: Medicaid Other | Attending: Obstetrics and Gynecology | Admitting: Obstetrics and Gynecology

## 2019-01-08 ENCOUNTER — Encounter (HOSPITAL_COMMUNITY): Payer: Self-pay

## 2019-01-08 ENCOUNTER — Ambulatory Visit (HOSPITAL_COMMUNITY)
Admission: RE | Admit: 2019-01-08 | Discharge: 2019-01-08 | Disposition: A | Discharge status: Home / Self Care | Payer: Medicaid Other | Source: Ambulatory Visit | Attending: Family Medicine | Admitting: Family Medicine

## 2019-01-08 DIAGNOSIS — Z362 Encounter for other antenatal screening follow-up: Secondary | ICD-10-CM

## 2019-01-08 DIAGNOSIS — A084 Viral intestinal infection, unspecified: Secondary | ICD-10-CM | POA: Diagnosis not present

## 2019-01-08 DIAGNOSIS — O2603 Excessive weight gain in pregnancy, third trimester: Secondary | ICD-10-CM | POA: Diagnosis not present

## 2019-01-08 DIAGNOSIS — Z87891 Personal history of nicotine dependence: Secondary | ICD-10-CM | POA: Insufficient documentation

## 2019-01-08 DIAGNOSIS — O99013 Anemia complicating pregnancy, third trimester: Secondary | ICD-10-CM | POA: Diagnosis not present

## 2019-01-08 DIAGNOSIS — E860 Dehydration: Secondary | ICD-10-CM | POA: Diagnosis not present

## 2019-01-08 DIAGNOSIS — O2602 Excessive weight gain in pregnancy, second trimester: Secondary | ICD-10-CM

## 2019-01-08 DIAGNOSIS — O09293 Supervision of pregnancy with other poor reproductive or obstetric history, third trimester: Secondary | ICD-10-CM | POA: Diagnosis not present

## 2019-01-08 DIAGNOSIS — Z3A34 34 weeks gestation of pregnancy: Secondary | ICD-10-CM | POA: Diagnosis not present

## 2019-01-08 DIAGNOSIS — O99213 Obesity complicating pregnancy, third trimester: Secondary | ICD-10-CM | POA: Insufficient documentation

## 2019-01-08 DIAGNOSIS — O09893 Supervision of other high risk pregnancies, third trimester: Secondary | ICD-10-CM

## 2019-01-08 DIAGNOSIS — O99613 Diseases of the digestive system complicating pregnancy, third trimester: Secondary | ICD-10-CM | POA: Insufficient documentation

## 2019-01-08 DIAGNOSIS — D649 Anemia, unspecified: Secondary | ICD-10-CM | POA: Diagnosis not present

## 2019-01-08 DIAGNOSIS — Z3689 Encounter for other specified antenatal screening: Secondary | ICD-10-CM

## 2019-01-08 DIAGNOSIS — R197 Diarrhea, unspecified: Secondary | ICD-10-CM | POA: Diagnosis present

## 2019-01-08 LAB — URINALYSIS, ROUTINE W REFLEX MICROSCOPIC
Bilirubin Urine: NEGATIVE
Glucose, UA: NEGATIVE mg/dL
Hgb urine dipstick: NEGATIVE
Ketones, ur: NEGATIVE mg/dL
Nitrite: NEGATIVE
Protein, ur: NEGATIVE mg/dL
Specific Gravity, Urine: >1.03 — ABNORMAL HIGH (ref 1.005–1.030)
pH: 6 (ref 5.0–8.0)

## 2019-01-08 LAB — COMPREHENSIVE METABOLIC PANEL
ALT: 11 U/L (ref 0–44)
AST: 14 U/L — ABNORMAL LOW (ref 15–41)
Albumin: 2.9 g/dL — ABNORMAL LOW (ref 3.5–5.0)
Alkaline Phosphatase: 99 U/L (ref 38–126)
Anion gap: 7 (ref 5–15)
BUN: 8 mg/dL (ref 6–20)
CO2: 19 mmol/L — ABNORMAL LOW (ref 22–32)
Calcium: 8.4 mg/dL — ABNORMAL LOW (ref 8.9–10.3)
Chloride: 109 mmol/L (ref 98–111)
Creatinine, Ser: 0.43 mg/dL — ABNORMAL LOW (ref 0.44–1.00)
GFR calc Af Amer: >60 mL/min (ref >60)
GFR calc non Af Amer: >60 mL/min (ref >60)
Glucose, Bld: 86 mg/dL (ref 70–99)
Potassium: 3.7 mmol/L (ref 3.5–5.1)
Sodium: 135 mmol/L (ref 135–145)
Total Bilirubin: 0.3 mg/dL (ref 0.3–1.2)
Total Protein: 7 g/dL (ref 6.5–8.1)

## 2019-01-08 LAB — CBC
HCT: 33.3 % — ABNORMAL LOW (ref 36.0–46.0)
Hemoglobin: 10.6 g/dL — ABNORMAL LOW (ref 12.0–15.0)
MCH: 28.3 pg (ref 26.0–34.0)
MCHC: 31.8 g/dL (ref 30.0–36.0)
MCV: 89 fL (ref 80.0–100.0)
PLATELETS: 306 10*3/uL (ref 150–400)
RBC: 3.74 MIL/uL — ABNORMAL LOW (ref 3.87–5.11)
RDW: 13.6 % (ref 11.5–15.5)
WBC: 9.6 10*3/uL (ref 4.0–10.5)
nRBC: 0 % (ref 0.0–0.2)

## 2019-01-08 LAB — URINALYSIS, MICROSCOPIC (REFLEX)

## 2019-01-08 MED ORDER — SODIUM CHLORIDE 0.9 % IV SOLN
8.0000 mg | Freq: Once | INTRAVENOUS | Status: AC
  Administered 2019-01-08: 8 mg via INTRAVENOUS
  Filled 2019-01-08: qty 4

## 2019-01-08 MED ORDER — LACTATED RINGERS IV BOLUS
1000.0000 mL | Freq: Once | INTRAVENOUS | Status: AC
  Administered 2019-01-08: 1000 mL via INTRAVENOUS

## 2019-01-08 MED ORDER — ONDANSETRON 4 MG PO TBDP: 4.0000 mg | ORAL_TABLET | Freq: Three times a day (TID) | ORAL | 0 refills | Status: DC | PRN

## 2019-01-08 MED ORDER — LOPERAMIDE HCL 2 MG PO CAPS: 2.0000 mg | ORAL_CAPSULE | ORAL | 0 refills | Status: DC | PRN

## 2019-01-08 NOTE — MAU Note (Signed)
Pt reports she had eaten out and then started having diarrhea, nausea, and vomited x 1, abd pain.

## 2019-01-08 NOTE — MAU Provider Note (Addendum)
History     CSN: [id number]  Arrival date and time: 01/08/19 0734     Chief Complaint  Patient presents with  . Abdominal Pain  . Emesis  . Nausea  . Diarrhea   HPI  Patient is a G2P1001 who presents at [redacted]w[redacted]d with diarrhea and nausea x 1 day.  Patient reports that she began having diarrhea yesterday afternoon and evening.  She reports that her stool has been watery, but denies blood or mucus in the stool.  She reports 5 episodes of diarrhea yesterday.  She notes that she has also been nauseous, and notes one episode of emesis today. Patient reports epigastric pain associated with the symptoms.  She also denies fevers, or chills with her symptoms.  Due to the patient's nausea and diarrhea, she states that she has not had anything to eat since yesterday at lunch.    Patient denies sick contacts, but does note that she ate at a new Asian restaurant on Sunday.  She notes that she had a meal with chicken, steak, vegetables and salad, and states that the food "looked suspicious". She denies any history of chronic diarrhea.    Patient reports normal fetal movement, and denies contractions.  She also denies vaginal discharge or bleeding.  She is currently being followed for prenatal care in a high risk pregnancy with the [facility].  She has a history of obesity and excess weight gain, history of gestational hypertension, low TSH, polyhydramnios, history of shoulder dystocia and short interval between pregnancies.    Past Medical History:  Diagnosis Date  . Medical history non-contributory   . Obesity     Past Surgical History:  Procedure Laterality Date  . NO PAST SURGERIES      History reviewed. No pertinent family history.  Social History   Tobacco Use  . Smoking status: Former Smoker    Last attempt to quit: 07/15/2017    Years since quitting: 1.4  . Smokeless tobacco: Never Used  Substance Use Topics  . Alcohol use: No    Frequency: Never  . Drug use: Not  Currently    Types: Marijuana    Comment: stopped 09/01//2019    Allergies: No Known Allergies  Medications Prior to Admission  Medication Sig Dispense Refill Last Dose  . aspirin EC 81 MG tablet Take 1 tablet (81 mg total) by mouth daily. 30 tablet 10 Taking  . Prenatal Multivit-Min-Fe-FA (PRENATAL VITAMINS) 0.8 MG tablet Take 1 tablet by mouth daily. 30 tablet 12 Taking    Review of Systems  Constitutional: Positive for appetite change. Negative for chills, fatigue and fever.  Cardiovascular: Negative for chest pain.  Gastrointestinal: Positive for abdominal pain, diarrhea, nausea and vomiting. Negative for blood in stool.  Genitourinary: Negative for pelvic pain, vaginal bleeding and vaginal discharge.   Physical Exam   Blood pressure (!) 146/80, pulse 97, temperature 98.6 F (37 C), temperature source Oral, resp. rate 17, height 5\' 3"  (1.6 m), weight 122 kg, last menstrual period 05/11/2018, SpO2 98 %, unknown if currently breastfeeding.  Physical Exam  Constitutional: She is oriented to person, place, and time. She appears well-developed and well-nourished.  Cardiovascular: Normal rate, regular rhythm and normal heart sounds. Exam reveals no gallop and no friction rub.  No murmur heard. Respiratory: Effort normal and breath sounds normal. No respiratory distress. She has no wheezes. She has no rales.  GI: Bowel sounds are normal. She exhibits no distension. There is abdominal tenderness (Epigastric).  Musculoskeletal: Normal  range of motion.  Neurological: She is alert and oriented to person, place, and time.  Psychiatric: She has a normal mood and affect.  EFM: 120 bpm, mod variability, + accels, no decels Toco: rare irritability  Results for orders placed or performed during the hospital encounter of 01/08/19 (from the past 24 hour(s))  Urinalysis, Routine w reflex microscopic     Status: Abnormal   Collection Time: 01/08/19  7:46 AM  Result Value Ref Range   Color,  Urine YELLOW YELLOW   APPearance CLEAR CLEAR   Specific Gravity, Urine >1.030 (H) 1.005 - 1.030   pH 6.0 5.0 - 8.0   Glucose, UA NEGATIVE NEGATIVE mg/dL   Hgb urine dipstick NEGATIVE NEGATIVE   Bilirubin Urine NEGATIVE NEGATIVE   Ketones, ur NEGATIVE NEGATIVE mg/dL   Protein, ur NEGATIVE NEGATIVE mg/dL   Nitrite NEGATIVE NEGATIVE   Leukocytes,Ua SMALL (A) NEGATIVE  Urinalysis, Microscopic (reflex)     Status: Abnormal   Collection Time: 01/08/19  7:46 AM  Result Value Ref Range   RBC / HPF 0-5 0 - 5 RBC/hpf   WBC, UA 0-5 0 - 5 WBC/hpf   Bacteria, UA FEW (A) NONE SEEN   Squamous Epithelial / LPF 6-10 0 - 5  CBC     Status: Abnormal   Collection Time: 01/08/19 10:06 AM  Result Value Ref Range   WBC 9.6 4.0 - 10.5 K/uL   RBC 3.74 (L) 3.87 - 5.11 MIL/uL   Hemoglobin 10.6 (L) 12.0 - 15.0 g/dL   HCT 91.6 (L) 94.5 - 03.8 %   MCV 89.0 80.0 - 100.0 fL   MCH 28.3 26.0 - 34.0 pg   MCHC 31.8 30.0 - 36.0 g/dL   RDW 88.2 80.0 - 34.9 %   Platelets 306 150 - 400 K/uL   nRBC 0.0 0.0 - 0.2 %  Comprehensive metabolic panel     Status: Abnormal   Collection Time: 01/08/19 10:06 AM  Result Value Ref Range   Sodium 135 135 - 145 mmol/L   Potassium 3.7 3.5 - 5.1 mmol/L   Chloride 109 98 - 111 mmol/L   CO2 19 (L) 22 - 32 mmol/L   Glucose, Bld 86 70 - 99 mg/dL   BUN 8 6 - 20 mg/dL   Creatinine, Ser 1.79 (L) 0.44 - 1.00 mg/dL   Calcium 8.4 (L) 8.9 - 10.3 mg/dL   Total Protein 7.0 6.5 - 8.1 g/dL   Albumin 2.9 (L) 3.5 - 5.0 g/dL   AST 14 (L) 15 - 41 U/L   ALT 11 0 - 44 U/L   Alkaline Phosphatase 99 38 - 126 U/L   Total Bilirubin 0.3 0.3 - 1.2 mg/dL   GFR calc non Af Amer >60 >60 mL/min   GFR calc Af Amer >60 >60 mL/min   Anion gap 7 5 - 15   MAU Course  Procedures  MDM Due to the patient's history of nausea, vomiting and diarrhea without contractions or vaginal discharge, condition correlates with viral gastroenteritis or food-bourne illness at this time.    Patient will undergo  urinalysis to access hydration status. Specific gravity 1.003. Patient will begin IV infusion of LR 1000 mg bolus and Ondansetron 8 mg in NaCl 0.9% 50 mL IVPB.  She will also undergo CBC and CMP to examine WBC, liver and kidney function, and electrolytes.   CBC exhibits mild normocytic anemia consistent with normal pregnancy and mild decrease in calcium due to increased fetal demand.    Following LR  bolus and Ondansetron, patient given PO challenge and tolerated well.  She will be discharged.    Assessment and Plan  [redacted] weeks gestation Viral gastroenteritis Dehydration Discharge home  Rx Imodium Rx Zofran Maintain Hydration [facility]- pt to schedule Return precautions   Francene Boyerslizabeth A Thompson 01/08/2019, 9:08 AM   CNM attestation:  HPI: Jeanette [name] is a 23 y.o. G2P1001 at 6063w4d presenting with N/V/D  ROS, labs, PMH reviewed N/V/D + FM, no LOF no VB no ctx  PE: Patient Vitals for the past 24 hrs:  BP Temp Temp src Pulse Resp SpO2 Height Weight  01/08/19 1154 135/67 97.7 F (36.5 C) Oral 90 18 100 % - -  01/08/19 0749 (!) 146/80 98.6 F (37 C) Oral 97 17 98 % 5\' 3"  (1.6 m) 122 kg   Gen: A&O x3, NAD Resp: normal effort and rate, no distress Heart: regular rate Abd: Soft, NT Pelvic: deferred  EFM:  FHR 120, mod variability, + accels, no decels TOCO: rare irritability  Orders Placed This Encounter  Procedures  . Urinalysis, Routine w reflex microscopic  . Urinalysis, Microscopic (reflex)  . CBC  . Comprehensive metabolic panel  . Discharge patient   Meds ordered this encounter  Medications  . lactated ringers bolus 1,000 mL  . ondansetron (ZOFRAN) 8 mg in sodium chloride 0.9 % 50 mL IVPB  . loperamide (IMODIUM) 2 MG capsule    Sig: Take 1 capsule (2 mg total) by mouth as needed for diarrhea or loose stools.    Dispense:  20 capsule    Refill:  0    Order Specific Question:   Supervising Provider    Answer:   Conan BowensDAVIS, KELLY M [[id number]]   . ondansetron (ZOFRAN ODT) 4 MG disintegrating tablet    Sig: Take 1 tablet (4 mg total) by mouth every 8 (eight) hours as needed for nausea or vomiting.    Dispense:  20 tablet    Refill:  0    Order Specific Question:   Supervising Provider    Answer:   Conan BowensDAVIS, KELLY M [[id number]]   MDM Labs ordered and reviewed. No further emesis. Nausea improved. Tolerating po. Stable for discharge home.  Assessment: 1. [redacted] weeks gestation of pregnancy   2. Excessive weight gain during pregnancy in second trimester   3. NST (non-stress test) reactive   4. Dehydration   5. Viral gastroenteritis   6. Anemia during pregnancy in third trimester    Plan: - Discharge home  - Return precautions - Follow-up as scheduled at [facility] for next prenatal visit or sooner as needed if symptoms worsen - Return to maternity admissions if symptoms worsen  Allergies as of 01/08/2019   No Known Allergies     Medication List    TAKE these medications   aspirin EC 81 MG tablet Take 1 tablet (81 mg total) by mouth daily.   loperamide 2 MG capsule Commonly known as:  IMODIUM Take 1 capsule (2 mg total) by mouth as needed for diarrhea or loose stools.   ondansetron 4 MG disintegrating tablet Commonly known as:  ZOFRAN ODT Take 1 tablet (4 mg total) by mouth every 8 (eight) hours as needed for nausea or vomiting.   Prenatal Vitamins 0.8 MG tablet Take 1 tablet by mouth daily.      I confirm that I have verified the information documented in the student's note. I personally was present during the history, physical exam, and medical decision-making activities  of this service and have verified that the service and findings are accurately documented in the student's note.  Donette Larry, CNM 01/08/2019 12:56 PM

## 2019-01-08 NOTE — Discharge Instructions (Signed)
Viral Gastroenteritis, Adult    Viral gastroenteritis is also known as the stomach flu. This condition is caused by certain germs (viruses). These germs can be passed from person to person very easily (are very contagious). This condition can cause sudden watery poop (diarrhea), fever, and throwing up (vomiting).  Having watery poop and throwing up can make you feel weak and cause you to get dehydrated. Dehydration can make you tired and thirsty, make you have a dry mouth, and make it so you pee (urinate) less often. Older adults and people with other diseases or a weak defense system (immune system) are at higher risk for dehydration. It is important to replace the fluids that you lose from having watery poop and throwing up.  Follow these instructions at home:  Follow instructions from your doctor about how to care for yourself at home.  Eating and drinking  Follow these instructions as told by your doctor:   Take an oral rehydration solution (ORS). This is a drink that is sold at pharmacies and stores.   Drink clear fluids in small amounts as you are able, such as:  ? Water.  ? Ice chips.  ? Diluted fruit juice.  ? Low-calorie sports drinks.   Eat bland, easy-to-digest foods in small amounts as you are able, such as:  ? Bananas.  ? Applesauce.  ? Rice.  ? Low-fat (lean) meats.  ? Toast.  ? Crackers.   Avoid fluids that have a lot of sugar or caffeine in them.   Avoid alcohol.   Avoid spicy or fatty foods.  General instructions     Drink enough fluid to keep your pee (urine) clear or pale yellow.   Wash your hands often. If you cannot use soap and water, use hand sanitizer.   Make sure that all people in your home wash their hands well and often.   Rest at home while you get better.   Take over-the-counter and prescription medicines only as told by your doctor.   Watch your condition for any changes.   Take a warm bath to help with any burning or pain from having watery poop.   Keep all follow-up  visits as told by your doctor. This is important.  Contact a doctor if:   You cannot keep fluids down.   Your symptoms get worse.   You have new symptoms.   You feel light-headed or dizzy.   You have muscle cramps.  Get help right away if:   You have chest pain.   You feel very weak or you pass out (faint).   You see blood in your throw-up.   Your throw-up looks like coffee grounds.   You have bloody or black poop (stools) or poop that look like tar.   You have a very bad headache, a stiff neck, or both.   You have a rash.   You have very bad pain, cramping, or bloating in your belly (abdomen).   You have trouble breathing.   You are breathing very quickly.   Your heart is beating very quickly.   Your skin feels cold and clammy.   You feel confused.   You have pain when you pee.   You have signs of dehydration, such as:  ? Dark pee, hardly any pee, or no pee.  ? Cracked lips.  ? Dry mouth.  ? Sunken eyes.  ? Sleepiness.  ? Weakness.  This information is not intended to replace advice given to you by your   health care provider. Make sure you discuss any questions you have with your health care provider.  Document Released: 04/25/2008 Document Revised: 08/01/2018 Document Reviewed: 07/14/2015  Elsevier Interactive Patient Education  2019 [employer]

## 2019-01-21 ENCOUNTER — Other Ambulatory Visit (HOSPITAL_COMMUNITY)
Admission: RE | Admit: 2019-01-21 | Discharge: 2019-01-21 | Disposition: A | Discharge status: Home / Self Care | Payer: Medicaid Other | Source: Ambulatory Visit | Attending: Obstetrics & Gynecology | Admitting: Obstetrics & Gynecology

## 2019-01-21 ENCOUNTER — Ambulatory Visit (INDEPENDENT_AMBULATORY_CARE_PROVIDER_SITE_OTHER): Payer: Medicaid Other | Admitting: Family Medicine

## 2019-01-21 VITALS — BP 124/74 | HR 106 | Wt 271.7 lb

## 2019-01-21 DIAGNOSIS — Z3A36 36 weeks gestation of pregnancy: Secondary | ICD-10-CM

## 2019-01-21 DIAGNOSIS — O0990 Supervision of high risk pregnancy, unspecified, unspecified trimester: Secondary | ICD-10-CM

## 2019-01-21 DIAGNOSIS — O0993 Supervision of high risk pregnancy, unspecified, third trimester: Secondary | ICD-10-CM

## 2019-01-21 DIAGNOSIS — Z6841 Body Mass Index (BMI) 40.0 and over, adult: Secondary | ICD-10-CM

## 2019-01-21 DIAGNOSIS — E669 Obesity, unspecified: Secondary | ICD-10-CM

## 2019-01-21 DIAGNOSIS — O403XX0 Polyhydramnios, third trimester, not applicable or unspecified: Secondary | ICD-10-CM

## 2019-01-21 DIAGNOSIS — Z8759 Personal history of other complications of pregnancy, childbirth and the puerperium: Secondary | ICD-10-CM

## 2019-01-21 DIAGNOSIS — O409XX0 Polyhydramnios, unspecified trimester, not applicable or unspecified: Secondary | ICD-10-CM

## 2019-01-21 DIAGNOSIS — O99213 Obesity complicating pregnancy, third trimester: Secondary | ICD-10-CM

## 2019-01-21 NOTE — Progress Notes (Signed)
   PRENATAL VISIT NOTE  Subjective:  Jeanette [name] is a 23 y.o. G2P1001 at [redacted]w[redacted]d being seen today for ongoing prenatal care.  She is currently monitored for the following issues for this high-risk pregnancy and has Supervision of high risk pregnancy, antepartum; Obesity affecting pregnancy, antepartum; History of gestational hypertension; History of shoulder dystocia in prior pregnancy; Short interval between pregnancies affecting pregnancy, antepartum; BMI 45.0-49.9, adult (HCC); Low TSH level; and Excessive weight gain during pregnancy in second trimester on their problem list.  Patient reports no complaints.  Contractions: Not present. Vag. Bleeding: None.  Movement: Present. Denies leaking of fluid.   The following portions of the patient's history were reviewed and updated as appropriate: allergies, current medications, past family history, past medical history, past social history, past surgical history and problem list. Problem list updated.  Objective:   Vitals:   01/21/19 1127  BP: 124/74  Pulse: (!) 106  Weight: 271 lb 11.2 oz (123.2 kg)    Fetal Status: Fetal Heart Rate (bpm): 131   Movement: Present  Presentation: Vertex  General:  Alert, oriented and cooperative. Patient is in no acute distress.  Skin: Skin is warm and dry. No rash noted.   Cardiovascular: Normal heart rate noted  Respiratory: Normal respiratory effort, no problems with respiration noted  Abdomen: Soft, gravid, appropriate for gestational age.  Pain/Pressure: Present     Pelvic: Cervical exam performed Dilation: Fingertip Effacement (%): Thick    Extremities: Normal range of motion.  Edema: None  Mental Status: Normal mood and affect. Normal behavior. Normal judgment and thought content.   Assessment and Plan:  Pregnancy: G2P1001 at [redacted]w[redacted]d  1. Supervision of high risk pregnancy, antepartum FHT and FH normal - Culture, beta strep (group b only) - GC/Chlamydia probe amp ([place])not at [facility]  2.  Polyhydramnios affecting pregnancy resolved  3. BMI 45.0-49.9, adult (HCC)  4. History of shoulder dystocia in prior pregnancy  5. History of gestational hypertension BP controlled  Preterm labor symptoms and general obstetric precautions including but not limited to vaginal bleeding, contractions, leaking of fluid and fetal movement were reviewed in detail with the patient. Please refer to After Visit Summary for other counseling recommendations.  Return in about 1 week (around 01/28/2019) for OB f/u.  No future appointments.  Levie Heritage, DO

## 2019-01-22 LAB — GC/CHLAMYDIA PROBE AMP (~~LOC~~) NOT AT ARMC
Chlamydia: NEGATIVE
Neisseria Gonorrhea: NEGATIVE

## 2019-01-24 LAB — CULTURE, BETA STREP (GROUP B ONLY): Strep Gp B Culture: NEGATIVE

## 2019-01-28 ENCOUNTER — Ambulatory Visit (INDEPENDENT_AMBULATORY_CARE_PROVIDER_SITE_OTHER): Payer: Medicaid Other | Admitting: Family Medicine

## 2019-01-28 VITALS — BP 128/77 | HR 87 | Wt 272.4 lb

## 2019-01-28 DIAGNOSIS — O0990 Supervision of high risk pregnancy, unspecified, unspecified trimester: Secondary | ICD-10-CM

## 2019-01-28 DIAGNOSIS — O0993 Supervision of high risk pregnancy, unspecified, third trimester: Secondary | ICD-10-CM

## 2019-01-28 DIAGNOSIS — Z3A37 37 weeks gestation of pregnancy: Secondary | ICD-10-CM

## 2019-01-28 NOTE — Patient Instructions (Signed)

## 2019-01-28 NOTE — Progress Notes (Signed)
   PRENATAL VISIT NOTE  Subjective:  Jeanette [name] is a 23 y.o. G2P1001 at [redacted]w[redacted]d being seen today for ongoing prenatal care.  She is currently monitored for the following issues for this low-risk pregnancy and has Supervision of high risk pregnancy, antepartum; Obesity affecting pregnancy, antepartum; History of gestational hypertension; History of shoulder dystocia in prior pregnancy; Short interval between pregnancies affecting pregnancy, antepartum; BMI 45.0-49.9, adult (HCC); Low TSH level; and Excessive weight gain during pregnancy in second trimester on their problem list.  Patient reports no complaints.  Contractions: Not present. Vag. Bleeding: None.  Movement: Present. Denies leaking of fluid.   The following portions of the patient's history were reviewed and updated as appropriate: allergies, current medications, past family history, past medical history, past social history, past surgical history and problem list.   Objective:   Vitals:   01/28/19 1630  BP: 128/77  Pulse: 87  Weight: 272 lb 6.4 oz (123.6 kg)    Fetal Status: Fetal Heart Rate (bpm): 145 Fundal Height: 38 cm Movement: Present  Presentation: Vertex  General:  Alert, oriented and cooperative. Patient is in no acute distress.  Skin: Skin is warm and dry. No rash noted.   Cardiovascular: Normal heart rate noted  Respiratory: Normal respiratory effort, no problems with respiration noted  Abdomen: Soft, gravid, appropriate for gestational age.  Pain/Pressure: Present     Pelvic: Cervical exam deferred        Extremities: Normal range of motion.  Edema: None  Mental Status: Normal mood and affect. Normal behavior. Normal judgment and thought content.   Assessment and Plan:  Pregnancy: G2P1001 at [redacted]w[redacted]d 1. Supervision of high risk pregnancy, antepartum For IOL at 39 wks, baby would be estimated to be 1/2 pound bigger at that date by u/s. Risks reviewed. Ways to initiate labor reviewed. GBS negative  Term labor  symptoms and general obstetric precautions including but not limited to vaginal bleeding, contractions, leaking of fluid and fetal movement were reviewed in detail with the patient. Please refer to After Visit Summary for other counseling recommendations.   Return in 1 week (on 02/04/2019).  No future appointments.  Reva Bores, MD

## 2019-01-28 NOTE — Progress Notes (Signed)
Induction scheduled for 02/08/19 @ 0630

## 2019-01-29 LAB — POCT URINALYSIS DIP (DEVICE)
Glucose, UA: NEGATIVE mg/dL
Hgb urine dipstick: NEGATIVE
Ketones, ur: NEGATIVE mg/dL
Leukocytes,Ua: NEGATIVE
NITRITE: NEGATIVE
Protein, ur: 30 mg/dL — AB
Specific Gravity, Urine: 1.025 (ref 1.005–1.030)
UROBILINOGEN UA: 2 mg/dL — AB (ref 0.0–1.0)
pH: 6.5 (ref 5.0–8.0)

## 2019-02-01 ENCOUNTER — Telehealth (HOSPITAL_COMMUNITY): Payer: Self-pay | Admitting: *Deleted

## 2019-02-01 NOTE — Telephone Encounter (Signed)
Preadmission screen  

## 2019-02-04 ENCOUNTER — Other Ambulatory Visit: Payer: Self-pay

## 2019-02-04 ENCOUNTER — Ambulatory Visit (INDEPENDENT_AMBULATORY_CARE_PROVIDER_SITE_OTHER): Payer: Medicaid Other | Admitting: Family Medicine

## 2019-02-04 VITALS — BP 129/82 | HR 103 | Wt 276.0 lb

## 2019-02-04 DIAGNOSIS — Z8759 Personal history of other complications of pregnancy, childbirth and the puerperium: Secondary | ICD-10-CM

## 2019-02-04 DIAGNOSIS — O0990 Supervision of high risk pregnancy, unspecified, unspecified trimester: Secondary | ICD-10-CM

## 2019-02-04 DIAGNOSIS — Z3A38 38 weeks gestation of pregnancy: Secondary | ICD-10-CM

## 2019-02-04 DIAGNOSIS — O0993 Supervision of high risk pregnancy, unspecified, third trimester: Secondary | ICD-10-CM

## 2019-02-04 NOTE — Patient Instructions (Signed)

## 2019-02-04 NOTE — Progress Notes (Signed)
   PRENATAL VISIT NOTE  Subjective:  Jeanette [name] is a 23 y.o. G2P1001 at [redacted]w[redacted]d being seen today for ongoing prenatal care.  She is currently monitored for the following issues for this high-risk pregnancy and has Supervision of high risk pregnancy, antepartum; Obesity affecting pregnancy, antepartum; History of gestational hypertension; History of shoulder dystocia in prior pregnancy; Short interval between pregnancies affecting pregnancy, antepartum; BMI 45.0-49.9, adult (HCC); Low TSH level; and Excessive weight gain during pregnancy in second trimester on their problem list.  Patient reports no complaints.  Contractions: Not present. Vag. Bleeding: None.  Movement: Present. Denies leaking of fluid.   The following portions of the patient's history were reviewed and updated as appropriate: allergies, current medications, past family history, past medical history, past social history, past surgical history and problem list.   Objective:   Vitals:   02/04/19 1500  BP: 129/82  Pulse: (!) 103  Weight: 276 lb (125.2 kg)    Fetal Status: Fetal Heart Rate (bpm): 121 Fundal Height: 44 cm Movement: Present     General:  Alert, oriented and cooperative. Patient is in no acute distress.  Skin: Skin is warm and dry. No rash noted.   Cardiovascular: Normal heart rate noted  Respiratory: Normal respiratory effort, no problems with respiration noted  Abdomen: Soft, gravid, appropriate for gestational age.  Pain/Pressure: Present     Pelvic: Cervical exam deferred        Extremities: Normal range of motion.  Edema: None  Mental Status: Normal mood and affect. Normal behavior. Normal judgment and thought content.   Assessment and Plan:  Pregnancy: G2P1001 at [redacted]w[redacted]d 1. Supervision of high risk pregnancy, antepartum Continue prenatal care.   2. History of gestational hypertension BP is ok  3. History of shoulder dystocia in prior pregnancy Declines primary C-section, for IOL at 39 wks, due  to hx, size of current baby. Ways to get labor to come on naturally reviewed  Term labor symptoms and general obstetric precautions including but not limited to vaginal bleeding, contractions, leaking of fluid and fetal movement were reviewed in detail with the patient. Please refer to After Visit Summary for other counseling recommendations.   Return in 1 week (on 02/11/2019).  Future Appointments  Date Time Provider Department Center  02/08/2019  6:30 AM [facility] SCHED ROOM [facility] None    Reva Bores, MD

## 2019-02-07 ENCOUNTER — Other Ambulatory Visit (HOSPITAL_COMMUNITY): Payer: Self-pay | Admitting: *Deleted

## 2019-02-08 ENCOUNTER — Inpatient Hospital Stay (HOSPITAL_COMMUNITY): Payer: Medicaid Other

## 2019-02-08 ENCOUNTER — Inpatient Hospital Stay (HOSPITAL_COMMUNITY): Payer: Medicaid Other | Admitting: Anesthesiology

## 2019-02-08 ENCOUNTER — Inpatient Hospital Stay (HOSPITAL_COMMUNITY)
Admission: AD | Admit: 2019-02-08 | Discharge: 2019-02-10 | DRG: 807 | Disposition: A | Discharge status: Home / Self Care | Payer: Medicaid Other | Attending: Obstetrics and Gynecology | Admitting: Obstetrics and Gynecology

## 2019-02-08 ENCOUNTER — Encounter (HOSPITAL_COMMUNITY): Payer: Self-pay

## 2019-02-08 ENCOUNTER — Other Ambulatory Visit: Payer: Self-pay

## 2019-02-08 DIAGNOSIS — O99210 Obesity complicating pregnancy, unspecified trimester: Secondary | ICD-10-CM | POA: Diagnosis present

## 2019-02-08 DIAGNOSIS — O0992 Supervision of high risk pregnancy, unspecified, second trimester: Secondary | ICD-10-CM

## 2019-02-08 DIAGNOSIS — O26893 Other specified pregnancy related conditions, third trimester: Secondary | ICD-10-CM | POA: Diagnosis present

## 2019-02-08 DIAGNOSIS — O99214 Obesity complicating childbirth: Secondary | ICD-10-CM | POA: Diagnosis present

## 2019-02-08 DIAGNOSIS — Z3A39 39 weeks gestation of pregnancy: Secondary | ICD-10-CM | POA: Diagnosis not present

## 2019-02-08 DIAGNOSIS — E669 Obesity, unspecified: Secondary | ICD-10-CM | POA: Diagnosis present

## 2019-02-08 DIAGNOSIS — Z87891 Personal history of nicotine dependence: Secondary | ICD-10-CM | POA: Diagnosis not present

## 2019-02-08 DIAGNOSIS — Z8759 Personal history of other complications of pregnancy, childbirth and the puerperium: Secondary | ICD-10-CM

## 2019-02-08 DIAGNOSIS — Z3490 Encounter for supervision of normal pregnancy, unspecified, unspecified trimester: Secondary | ICD-10-CM

## 2019-02-08 LAB — TYPE AND SCREEN
ABO/RH(D): A POS
Antibody Screen: NEGATIVE

## 2019-02-08 LAB — CBC
HCT: 32.8 % — ABNORMAL LOW (ref 36.0–46.0)
Hemoglobin: 10.2 g/dL — ABNORMAL LOW (ref 12.0–15.0)
MCH: 26.5 pg (ref 26.0–34.0)
MCHC: 31.1 g/dL (ref 30.0–36.0)
MCV: 85.2 fL (ref 80.0–100.0)
PLATELETS: 347 10*3/uL (ref 150–400)
RBC: 3.85 MIL/uL — AB (ref 3.87–5.11)
RDW: 13.9 % (ref 11.5–15.5)
WBC: 11.1 10*3/uL — ABNORMAL HIGH (ref 4.0–10.5)
nRBC: 0 % (ref 0.0–0.2)

## 2019-02-08 LAB — ABO/RH: ABO/RH(D): A POS

## 2019-02-08 LAB — RPR: RPR Ser Ql: NONREACTIVE

## 2019-02-08 MED ORDER — OXYCODONE-ACETAMINOPHEN 5-325 MG PO TABS: 1.0000 | ORAL_TABLET | ORAL | Status: DC | PRN

## 2019-02-08 MED ORDER — TERBUTALINE SULFATE 1 MG/ML IJ SOLN: 0.2500 mg | Freq: Once | INTRAMUSCULAR | Status: DC | PRN

## 2019-02-08 MED ORDER — OXYTOCIN 40 UNITS IN NORMAL SALINE INFUSION - SIMPLE MED
1.0000 m[IU]/min | INTRAVENOUS | Status: DC
  Administered 2019-02-08: 2 m[IU]/min via INTRAVENOUS
  Filled 2019-02-08: qty 1000

## 2019-02-08 MED ORDER — PHENYLEPHRINE 40 MCG/ML (10ML) SYRINGE FOR IV PUSH (FOR BLOOD PRESSURE SUPPORT)
80.0000 ug | PREFILLED_SYRINGE | INTRAVENOUS | Status: DC | PRN
  Filled 2019-02-08: qty 10

## 2019-02-08 MED ORDER — PHENYLEPHRINE 40 MCG/ML (10ML) SYRINGE FOR IV PUSH (FOR BLOOD PRESSURE SUPPORT): 80.0000 ug | PREFILLED_SYRINGE | INTRAVENOUS | Status: DC | PRN

## 2019-02-08 MED ORDER — EPHEDRINE 5 MG/ML INJ: 10.0000 mg | INTRAVENOUS | Status: DC | PRN

## 2019-02-08 MED ORDER — LIDOCAINE HCL (PF) 1 % IJ SOLN
INTRAMUSCULAR | Status: DC | PRN
  Administered 2019-02-08: 6 mL via EPIDURAL

## 2019-02-08 MED ORDER — MISOPROSTOL 50MCG HALF TABLET
ORAL_TABLET | ORAL | Status: AC
  Filled 2019-02-08: qty 1

## 2019-02-08 MED ORDER — FENTANYL CITRATE (PF) 100 MCG/2ML IJ SOLN
100.0000 ug | INTRAMUSCULAR | Status: DC | PRN
  Filled 2019-02-08 (×2): qty 2

## 2019-02-08 MED ORDER — ACETAMINOPHEN 325 MG PO TABS: 650.0000 mg | ORAL_TABLET | ORAL | Status: DC | PRN

## 2019-02-08 MED ORDER — OXYTOCIN BOLUS FROM INFUSION
500.0000 mL | Freq: Once | INTRAVENOUS | Status: AC
  Administered 2019-02-08: 500 mL via INTRAVENOUS

## 2019-02-08 MED ORDER — LACTATED RINGERS IV SOLN
500.0000 mL | INTRAVENOUS | Status: DC | PRN
  Administered 2019-02-08: 1000 mL via INTRAVENOUS

## 2019-02-08 MED ORDER — ONDANSETRON HCL 4 MG/2ML IJ SOLN
4.0000 mg | Freq: Four times a day (QID) | INTRAMUSCULAR | Status: DC | PRN
  Administered 2019-02-08: 4 mg via INTRAVENOUS
  Filled 2019-02-08: qty 2

## 2019-02-08 MED ORDER — LACTATED RINGERS IV SOLN
INTRAVENOUS | Status: DC
  Administered 2019-02-08: 07:00:00 via INTRAVENOUS
  Administered 2019-02-08: 125 mL/h via INTRAVENOUS

## 2019-02-08 MED ORDER — FENTANYL-BUPIVACAINE-NACL 0.5-0.125-0.9 MG/250ML-% EP SOLN
12.0000 mL/h | EPIDURAL | Status: DC | PRN
  Filled 2019-02-08: qty 250

## 2019-02-08 MED ORDER — SODIUM CHLORIDE (PF) 0.9 % IJ SOLN
INTRAMUSCULAR | Status: DC | PRN
  Administered 2019-02-08: 12 mL/h via EPIDURAL

## 2019-02-08 MED ORDER — LIDOCAINE HCL (PF) 1 % IJ SOLN: 30.0000 mL | INTRAMUSCULAR | Status: DC | PRN

## 2019-02-08 MED ORDER — FLEET ENEMA 7-19 GM/118ML RE ENEM: 1.0000 | ENEMA | RECTAL | Status: DC | PRN

## 2019-02-08 MED ORDER — MISOPROSTOL 50MCG HALF TABLET
50.0000 ug | ORAL_TABLET | ORAL | Status: DC | PRN
  Administered 2019-02-08 (×2): 50 ug via BUCCAL
  Filled 2019-02-08: qty 1

## 2019-02-08 MED ORDER — SOD CITRATE-CITRIC ACID 500-334 MG/5ML PO SOLN: 30.0000 mL | ORAL | Status: DC | PRN

## 2019-02-08 MED ORDER — DIPHENHYDRAMINE HCL 50 MG/ML IJ SOLN: 12.5000 mg | INTRAMUSCULAR | Status: DC | PRN

## 2019-02-08 MED ORDER — OXYTOCIN 40 UNITS IN NORMAL SALINE INFUSION - SIMPLE MED
2.5000 [IU]/h | INTRAVENOUS | Status: DC
  Administered 2019-02-08: 2.5 [IU]/h via INTRAVENOUS

## 2019-02-08 MED ORDER — LACTATED RINGERS IV SOLN: 500.0000 mL | Freq: Once | INTRAVENOUS | Status: DC

## 2019-02-08 MED ORDER — OXYCODONE-ACETAMINOPHEN 5-325 MG PO TABS: 2.0000 | ORAL_TABLET | ORAL | Status: DC | PRN

## 2019-02-08 NOTE — Anesthesia Procedure Notes (Signed)
Epidural Patient location during procedure: OB Start time: 02/08/2019 9:28 PM End time: 02/08/2019 9:30 PM  Staffing Anesthesiologist: Bethena Midget, MD  Preanesthetic Checklist Completed: patient identified, site marked, surgical consent, pre-op evaluation, timeout performed, IV checked, risks and benefits discussed and monitors and equipment checked  Epidural Patient position: sitting Prep: site prepped and draped and DuraPrep Patient monitoring: continuous pulse ox and blood pressure Approach: midline Location: L3-L4 Injection technique: LOR air  Needle:  Needle type: Tuohy  Needle gauge: 17 G Needle length: 9 cm and 9 Needle insertion depth: 9 cm Catheter type: closed end flexible Catheter size: 19 Gauge Catheter at skin depth: 14 cm Test dose: negative  Assessment Events: blood not aspirated, injection not painful, no injection resistance, negative IV test and no paresthesia

## 2019-02-08 NOTE — Progress Notes (Signed)
   Jeanette [name] is a 23 y.o. G2P1001 at [redacted]w[redacted]d  admitted for history of shoulder dystocia.   Subjective: Patient coping well; no complaints.   Objective: Vitals:   02/08/19 0700 02/08/19 0703 02/08/19 0759 02/08/19 1030  BP: 138/75  124/74 126/61  Pulse: 91  94 100  Resp: 18   18  Temp: 98.4 F (36.9 C)     TempSrc: Oral     Weight:  123.4 kg    Height:  5\' 3"  (1.6 m)     No intake/output data recorded.  FHT:  FHR: 120 bpm, variability: moderate,  accelerations:  Present,  decelerations:  Absent UC:   none SVE:   Dilation: 2 Effacement (%): Thick Station: Ballotable Exam by:: Welford Roche, RNC  Labs: Lab Results  Component Value Date   WBC 11.1 (H) 02/08/2019   HGB 10.2 (L) 02/08/2019   HCT 32.8 (L) 02/08/2019   MCV 85.2 02/08/2019   PLT 347 02/08/2019    Assessment / Plan: FB placed without incident; will give another dose of cytotec.   Labor: early labor Fetal Wellbeing:  Category I Pain Control:  Epidural Anticipated MOD:  NSVD  Marylene Land 02/08/2019, 12:51 PM

## 2019-02-08 NOTE — H&P (Addendum)
LABOR AND DELIVERY ADMISSION HISTORY AND PHYSICAL NOTE  Jeanette [name] is a 23 y.o. female G2P1001 with IUP at [redacted]w[redacted]d by LMP consistent with 12w u/s presenting for IOL for h/o shoulder dystocia.  She reports positive fetal movement. She denies leakage of fluid or vaginal bleeding.  Prenatal History/Complications: PNC at [facility] Pregnancy complications:  - H/o shoulder dystocia, Baby 3249g at [redacted]w[redacted]d - Obesity, BMP>45 - h/o gHTN - Short interval between pregnancies  - Low TSH (10/2), normal T4 on 11/09/18  Past Medical History: Past Medical History:  Diagnosis Date  . Medical history non-contributory   . Obesity     Past Surgical History: Past Surgical History:  Procedure Laterality Date  . NO PAST SURGERIES      Obstetrical History: OB History    Gravida  2   Para  1   Term  1   Preterm  0   AB  0   Living  1     SAB  0   TAB  0   Ectopic  0   Multiple  0   Live Births  1           Social History: Social History   Socioeconomic History  . Marital status: Significant Other    Spouse name: Not on file  . Number of children: 1  . Years of education: Not on file  . Highest education level: Not on file  Occupational History  . Not on file  Social Needs  . Financial resource strain: Not hard at all  . Food insecurity:    Worry: Never true    Inability: Never true  . Transportation needs:    Medical: No    Non-medical: No  Tobacco Use  . Smoking status: Former Smoker    Last attempt to quit: 07/15/2017    Years since quitting: 1.5  . Smokeless tobacco: Never Used  Substance and Sexual Activity  . Alcohol use: No    Frequency: Never  . Drug use: Not Currently    Types: Marijuana    Comment: stopped 09/01//2019  . Sexual activity: Yes    Birth control/protection: None  Lifestyle  . Physical activity:    Days per week: Not on file    Minutes per session: Not on file  . Stress: Not on file  Relationships  . Social connections:   Talks on phone: Not on file    Gets together: Not on file    Attends religious service: Not on file    Active member of club or organization: Not on file    Attends meetings of clubs or organizations: Not on file    Relationship status: Not on file  Other Topics Concern  . Not on file  Social History Narrative  . Not on file    Family History: History reviewed. No pertinent family history.  Allergies: No Known Allergies  Medications Prior to Admission  Medication Sig Dispense Refill Last Dose  . aspirin EC 81 MG tablet Take 1 tablet (81 mg total) by mouth daily. 30 tablet 10 02/07/2019 at Unknown time  . Prenatal Multivit-Min-Fe-FA (PRENATAL VITAMINS) 0.8 MG tablet Take 1 tablet by mouth daily. 30 tablet 12 02/07/2019 at Unknown time  . loperamide (IMODIUM) 2 MG capsule Take 1 capsule (2 mg total) by mouth as needed for diarrhea or loose stools. (Patient not taking: Reported on 01/21/2019) 20 capsule 0 Not Taking at Unknown time  . ondansetron (ZOFRAN ODT) 4 MG disintegrating tablet Take 1 tablet (  4 mg total) by mouth every 8 (eight) hours as needed for nausea or vomiting. (Patient not taking: Reported on 01/21/2019) 20 tablet 0 Not Taking at Unknown time     Review of Systems  All systems reviewed and negative except as stated in HPI  Physical Exam Blood pressure 126/61, pulse 100, temperature 98.4 F (36.9 C), temperature source Oral, resp. rate 18, height 5\' 3"  (1.6 m), weight 123.4 kg, last menstrual period 05/11/2018, unknown if currently breastfeeding. General appearance: alert, oriented, NAD Lungs: normal respiratory effort Heart: regular rate Abdomen: soft, non-tender; gravid, FH appropriate for GA Extremities: No calf swelling or tenderness Presentation: cephalic Fetal monitoring: baseline 120bpm, moderate variability, + acels, no decels Uterine activity: Irregular, rare Dilation: 2 Effacement (%): Thick Station: Ballotable Exam by:: Welford Roche, RNC  Prenatal  labs: ABO, Rh: --/--/A POS, A POS Performed at [facility], [address] [place], [place] [address]  [phone]) Antibody: NEG (03/20 0654) Rubella: 1.10 (10/02 1626) RPR: Non Reactive (12/20 0902)  HBsAg: Negative (10/02 1626)  HIV: Non Reactive (12/20 0902)  GC/Chlamydia: Negative GBS: Negative (04/20 0725)  2-hr GTT/A1C: WNL 75/103/121, 5.3 Genetic screening:  Panorama: low risk, AFP neg Anatomy US: Normal  Prenatal Transfer Tool  Maternal Diabetes: No Genetic Screening: Normal Maternal Ultrasounds/Referrals: Normal Fetal Ultrasounds or other Referrals:  None Maternal Substance Abuse:  No Significant Maternal Medications:  Meds include: Other: ASA, Imodium, Zofran Significant Maternal Lab Results: Lab values include: Group B Strep negative, Other: Low TSH, elevated T3, normal T4   Results for orders placed or performed during the hospital encounter of 02/08/19 (from the past 24 hour(s))  CBC   Collection Time: 02/08/19  6:54 AM  Result Value Ref Range   WBC 11.1 (H) 4.0 - 10.5 K/uL   RBC 3.85 (L) 3.87 - 5.11 MIL/uL   Hemoglobin 10.2 (L) 12.0 - 15.0 g/dL   HCT 27.5 (L) 17.0 - 01.7 %   MCV 85.2 80.0 - 100.0 fL   MCH 26.5 26.0 - 34.0 pg   MCHC 31.1 30.0 - 36.0 g/dL   RDW 49.4 49.6 - 75.9 %   Platelets 347 150 - 400 K/uL   nRBC 0.0 0.0 - 0.2 %  Type and screen   Collection Time: 02/08/19  6:54 AM  Result Value Ref Range   ABO/RH(D) A POS    Antibody Screen NEG    Sample Expiration      02/11/2019 Performed at [facility], [address], [place], [place] [address]   ABO/Rh   Collection Time: 02/08/19  6:54 AM  Result Value Ref Range   ABO/RH(D)      A POS Performed at [facility], [address] [place], [place] [address]     Patient Active Problem List   Diagnosis Date Noted  . Term pregnancy 02/08/2019  . Excessive weight gain during pregnancy in second trimester 11/09/2018  . Low TSH level 08/28/2018  . History of shoulder  dystocia in prior pregnancy 08/08/2018  . Short interval between pregnancies affecting pregnancy, antepartum 08/08/2018  . BMI 45.0-49.9, adult (HCC) 08/08/2018  . History of gestational hypertension 03/10/2018  . Obesity affecting pregnancy, antepartum 09/13/2017  . Supervision of high risk pregnancy, antepartum 09/06/2017    Assessment: Jeanette [name] is a 23 y.o. G2P1001 at [redacted]w[redacted]d here for IOL for h/o shoulder dystocia.  #Labor: cyto #2, foley bulb placed #Pain: Desires epidural. IV pain meds/epidural upon request #FWB: Cat I #ID:  GBS  neg, GC/Cl neg  #Postpartum Planning: - [x] Flu / [x]  TDAP / Rubella immune - boy(out)/form/IUD   Dara Lords Mullis 02/08/2019, 12:34 PM  I confirm that I have verified the information documented in the resident student's note and that I have also personally reperformed the history, physical exam and all medical decision making activities of this service and have verified that all service and findings are accurately documented in this student's note.   Marylene Land, CNM 02/08/2019 12:50 PM

## 2019-02-08 NOTE — Progress Notes (Signed)
Patient ID: Jeanette [name], female   DOB: 08-Jul-1996, 23 y.o.   MRN: [id number]  Epidural recently placed, but pt not comfortable yet; feeling vag pressure  BP 139/69, P 96 FHR 120s, +accels, occ mi variables Ctx q 3 mins per pt reaction Cx 7/90/vtx -1  IUP@term  Active labor  Will attempt to use epidural PCA to achieve greater comfort Anticipate SVD soon as pt is making rapid change  Arabella Merles 02/08/2019

## 2019-02-08 NOTE — Anesthesia Preprocedure Evaluation (Signed)
Anesthesia Evaluation  Patient identified by MRN, date of birth, ID band Patient awake    Reviewed: Allergy & Precautions, H&P , NPO status , Patient's Chart, lab work & pertinent test results, reviewed documented beta blocker date and time   Airway Mallampati: III  TM Distance: >3 FB Neck ROM: full    Dental no notable dental hx.    Pulmonary neg pulmonary ROS, former smoker,    Pulmonary exam normal breath sounds clear to auscultation       Cardiovascular negative cardio ROS Normal cardiovascular exam Rhythm:regular Rate:Normal     Neuro/Psych negative neurological ROS  negative psych ROS   GI/Hepatic negative GI ROS, Neg liver ROS,   Endo/Other  Morbid obesity  Renal/GU negative Renal ROS  negative genitourinary   Musculoskeletal   Abdominal   Peds  Hematology negative hematology ROS (+)   Anesthesia Other Findings   Reproductive/Obstetrics (+) Pregnancy                             Anesthesia Physical Anesthesia Plan  ASA: III  Anesthesia Plan: Epidural   Post-op Pain Management:    Induction:   PONV Risk Score and Plan:   Airway Management Planned:   Additional Equipment:   Intra-op Plan:   Post-operative Plan:   Informed Consent: I have reviewed the patients History and Physical, chart, labs and discussed the procedure including the risks, benefits and alternatives for the proposed anesthesia with the patient or authorized representative who has indicated his/her understanding and acceptance.       Plan Discussed with: Anesthesiologist, CRNA and Surgeon  Anesthesia Plan Comments:         Anesthesia Quick Evaluation

## 2019-02-09 ENCOUNTER — Encounter (HOSPITAL_COMMUNITY): Payer: Self-pay

## 2019-02-09 MED ORDER — DIPHENHYDRAMINE HCL 25 MG PO CAPS: 25.0000 mg | ORAL_CAPSULE | Freq: Four times a day (QID) | ORAL | Status: DC | PRN

## 2019-02-09 MED ORDER — SENNOSIDES-DOCUSATE SODIUM 8.6-50 MG PO TABS
2.0000 | ORAL_TABLET | ORAL | Status: DC
  Administered 2019-02-10: 2 via ORAL
  Filled 2019-02-09: qty 2

## 2019-02-09 MED ORDER — ONDANSETRON HCL 4 MG PO TABS: 4.0000 mg | ORAL_TABLET | ORAL | Status: DC | PRN

## 2019-02-09 MED ORDER — ACETAMINOPHEN 325 MG PO TABS: 650.0000 mg | ORAL_TABLET | ORAL | Status: DC | PRN

## 2019-02-09 MED ORDER — ZOLPIDEM TARTRATE 5 MG PO TABS: 5.0000 mg | ORAL_TABLET | Freq: Every evening | ORAL | Status: DC | PRN

## 2019-02-09 MED ORDER — IBUPROFEN 600 MG PO TABS
600.0000 mg | ORAL_TABLET | Freq: Four times a day (QID) | ORAL | Status: DC
  Administered 2019-02-09 – 2019-02-10 (×6): 600 mg via ORAL
  Filled 2019-02-09 (×6): qty 1

## 2019-02-09 MED ORDER — PRENATAL MULTIVITAMIN CH
1.0000 | ORAL_TABLET | Freq: Every day | ORAL | Status: DC
  Administered 2019-02-09: 1 via ORAL
  Filled 2019-02-09: qty 1

## 2019-02-09 MED ORDER — COCONUT OIL OIL: 1.0000 "application " | TOPICAL_OIL | Status: DC | PRN

## 2019-02-09 MED ORDER — WITCH HAZEL-GLYCERIN EX PADS: 1.0000 "application " | MEDICATED_PAD | CUTANEOUS | Status: DC | PRN

## 2019-02-09 MED ORDER — ONDANSETRON HCL 4 MG/2ML IJ SOLN: 4.0000 mg | INTRAMUSCULAR | Status: DC | PRN

## 2019-02-09 MED ORDER — SIMETHICONE 80 MG PO CHEW: 80.0000 mg | CHEWABLE_TABLET | ORAL | Status: DC | PRN

## 2019-02-09 MED ORDER — DIBUCAINE 1 % RE OINT: 1.0000 "application " | TOPICAL_OINTMENT | RECTAL | Status: DC | PRN

## 2019-02-09 MED ORDER — OXYCODONE-ACETAMINOPHEN 5-325 MG PO TABS: 1.0000 | ORAL_TABLET | ORAL | Status: DC | PRN

## 2019-02-09 MED ORDER — TETANUS-DIPHTH-ACELL PERTUSSIS 5-2.5-18.5 LF-MCG/0.5 IM SUSP: 0.5000 mL | Freq: Once | INTRAMUSCULAR | Status: DC

## 2019-02-09 MED ORDER — OXYCODONE-ACETAMINOPHEN 5-325 MG PO TABS: 2.0000 | ORAL_TABLET | ORAL | Status: DC | PRN

## 2019-02-09 MED ORDER — BENZOCAINE-MENTHOL 20-0.5 % EX AERO: 1.0000 "application " | INHALATION_SPRAY | CUTANEOUS | Status: DC | PRN

## 2019-02-09 NOTE — Anesthesia Postprocedure Evaluation (Signed)
Anesthesia Post Note  Patient: Jeanette [name]  Procedure(s) Performed: AN AD HOC LABOR EPIDURAL     Patient location during evaluation: Mother Baby Anesthesia Type: Epidural Level of consciousness: awake and alert Pain management: pain level controlled Vital Signs Assessment: post-procedure vital signs reviewed and stable Respiratory status: spontaneous breathing, nonlabored ventilation and respiratory function stable Cardiovascular status: stable Postop Assessment: no headache, no backache and epidural receding Anesthetic complications: no    Last Vitals:  Vitals:   02/09/19 0229 02/09/19 0539  BP: 124/73 128/69  Pulse: 97 86  Resp: 18 18  Temp: 36.8 C 36.9 C  SpO2:      Last Pain:  Vitals:   02/09/19 0725  TempSrc:   PainSc: Asleep   Pain Goal:                   Trellis Paganini

## 2019-02-09 NOTE — Discharge Summary (Addendum)
Postpartum Discharge Summary   Patient Name: Jeanette [name] DOB: 1996/06/10 MRN: [id number]  Date of admission: 02/08/2019 Delivering Provider: Ria Comment   Date of discharge: 02/10/2019  Admitting diagnosis: pregnancy Intrauterine pregnancy: [redacted]w[redacted]d     Secondary diagnosis:  Active Problems:   Obesity affecting pregnancy, antepartum   History of gestational hypertension   History of shoulder dystocia in prior pregnancy   Term pregnancy     Discharge diagnosis: Term Pregnancy Delivered                                                Postpartum procedures:none Augmentation: Cytotec and Foley Balloon, pitocin Complications: None  Hospital course:  Induction of Labor With Vaginal Delivery   23 y.o. yo G2P2002 at [redacted]w[redacted]d was admitted to the hospital 02/08/2019 for induction of labor.  Indication for induction: history of shoulder dystocia.  Patient had an uncomplicated labor course as follows: Membrane Rupture Time/Date: 9:30 PM ,02/08/2019   Intrapartum Procedures: Episiotomy: None [1]                                         Lacerations:  None [1]  Patient had delivery of a Viable infant.  Information for the patient's newborn:  Sherlin, Sicari [[id number]]  Delivery Method: Vaginal, Spontaneous(Filed from Delivery Summary)   02/08/2019  Details of delivery can be found in separate delivery note.  Patient had a routine postpartum course. Patient is discharged home 02/10/19.  Magnesium Sulfate recieved: No BMZ received: No  Physical exam  Vitals:   02/09/19 0539 02/09/19 1645 02/09/19 2157 02/10/19 0519  BP: 128/69 125/72 115/68 120/65  Pulse: 86 89 83 84  Resp: 18 18 16    Temp: 98.4 F (36.9 C) 98.4 F (36.9 C) 98.3 F (36.8 C) 97.8 F (36.6 C)  TempSrc: Oral Oral Oral Oral  SpO2:    100%  Weight:      Height:       General: alert, well-appearing, NAD Lochia: appropriate Uterine Fundus: firm Incision: N/A DVT Evaluation: No significant calf/ankle  edema  Labs: Lab Results  Component Value Date   WBC 11.1 (H) 02/08/2019   HGB 10.2 (L) 02/08/2019   HCT 32.8 (L) 02/08/2019   MCV 85.2 02/08/2019   PLT 347 02/08/2019   CMP Latest Ref Rng & Units 01/08/2019  Glucose 70 - 99 mg/dL 86  BUN 6 - 20 mg/dL 8  Creatinine 6.21 - 3.08 mg/dL 6.57(Q)  Sodium 469 - 629 mmol/L 135  Potassium 3.5 - 5.1 mmol/L 3.7  Chloride 98 - 111 mmol/L 109  CO2 22 - 32 mmol/L 19(L)  Calcium 8.9 - 10.3 mg/dL 5.2(W)  Total Protein 6.5 - 8.1 g/dL 7.0  Total Bilirubin 0.3 - 1.2 mg/dL 0.3  Alkaline Phos 38 - 126 U/L 99  AST 15 - 41 U/L 14(L)  ALT 0 - 44 U/L 11    Discharge instruction: per After Visit Summary and "Baby and Me Booklet".  After visit meds:  Allergies as of 02/10/2019   No Known Allergies     Medication List    STOP taking these medications   aspirin EC 81 MG tablet   loperamide 2 MG capsule Commonly known as:  IMODIUM   ondansetron 4 MG disintegrating tablet Commonly known  as:  Zofran ODT     TAKE these medications   ibuprofen 800 MG tablet Commonly known as:  ADVIL,MOTRIN Take 1 tablet (800 mg total) by mouth 3 (three) times daily.   Prenatal Vitamins 0.8 MG tablet Take 1 tablet by mouth daily.   senna-docusate 8.6-50 MG tablet Commonly known as:  Senokot-S Take 2 tablets by mouth at bedtime as needed for mild constipation.      Diet: routine diet Activity: Advance as tolerated. Pelvic rest for 6 weeks.   Outpatient follow up: Message sent to schedule visit in 4-6 weeks   Follow-up Visit: Follow-up Information    [facility]. Schedule an appointment as soon as possible for a visit.   Specialty:  Obstetrics and Gynecology Why:  You should receive a call to schedule a postpartum visit. If you do not, please call clinic to make an appointment in 4-6 weeks.  Contact information: [address] [place] [address] [phone]         Please schedule this patient for  Postpartum visit in: 4 weeks with the following provider: Any provider For C/S patients schedule nurse incision check in weeks 2 weeks: no Low risk pregnancy complicated by: hx shoulder dystocia Delivery mode:  SVD Anticipated Birth Control:  IUD PP Procedures needed: none  Schedule Integrated BH visit: no  Newborn Data: Live born female  Birth Weight:  3425g APGAR: 9, 9   Newborn Delivery   Birth date/time:  02/08/2019 23:15:00 Delivery type:  Vaginal, Spontaneous    Baby Feeding: Bottle Disposition:home with mother  Burman Nieves, MD - PGY1 Faculty Service, Resident    OB FELLOW DISCHARGE ATTESTATION  I have seen and examined this patient and agree with above documentation in the resident's note.   Marcy Siren, D.O. OB Fellow  02/10/2019, 10:58 AM

## 2019-02-09 NOTE — Progress Notes (Signed)
Post Partum Day 1 Subjective: voiding, tolerating PO and + flatus. Required assistance with wheel chair to get to the bathroom this morning. Has not ambulated since delivery.   Objective: Blood pressure 128/69, pulse 86, temperature 98.4 F (36.9 C), temperature source Oral, resp. rate 18, height 5\' 3"  (1.6 m), weight 123.4 kg, last menstrual period 05/11/2018, SpO2 99 %, unknown if currently breastfeeding.  Physical Exam:  General: alert, cooperative and no distress Lochia: appropriate Uterine Fundus: firm DVT Evaluation: No cords or calf tenderness. No significant calf/ankle edema.  Recent Labs    02/08/19 0654  HGB 10.2*  HCT 32.8*    Assessment/Plan:  Continue routine postpartum care. Encouraged ambulation today.  Plan for discharge tomorrow, Social Work consult and Contraception - IUD    LOS: 1 day   Burman Nieves, MD Family Medicine Resident  02/09/2019, 4:23 PM

## 2019-02-09 NOTE — Clinical Social Work Maternal (Signed)
CLINICAL SOCIAL WORK MATERNAL/CHILD NOTE  Patient Details  Name: Jeanette [name] MRN: [id number] Date of Birth: 02/08/2018  Date:  02/09/2019  Clinical Social Worker Initiating Note:  Madilyn Fireman, MSW, LCSW-A Date/Time: Initiated:  02/09/19/1235     Child's Name:  Jeanette [name]   Biological Parents:  Mother, Father   Need for Interpreter:  None   Reason for Referral:  Current Substance Use/Substance Use During Pregnancy    Address:  [place] [address]    Phone number:  [phone] (home)     Additional phone number:   Household Members/Support Persons (HM/SP):   Household Member/Support Person 1   HM/SP Name Relationship DOB or Age  HM/SP -1 Diamante Nichols FOB 12/29/1995  HM/SP -2        HM/SP -3        HM/SP -4        HM/SP -5        HM/SP -6        HM/SP -7        HM/SP -8          Natural Supports (not living in the home):  Extended Family, Immediate Family, Friends   Chiropodist: None   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   Education:  [employer] school graduate   Homebound arranged:    Museum/gallery curator Resources:  Medicaid   Other Resources:  [facility]   Cultural/Religious Considerations Which May Impact Care:  None  Strengths:  Ability to meet basic needs , Home prepared for child , Pediatrician chosen   Psychotropic Medications:         Pediatrician:    [place] area  Pediatrician List:   [facility] [place]      Pediatrician Fax Number:    Risk Factors/Current Problems:      Cognitive State:  Able to Concentrate , Alert    Mood/Affect:  Bright , Calm , Comfortable    CSW Assessment: CSW received consult for MOB due to history of marijuana use. CSW met with MOB and newborn Janari at bedside to complete assessment. CSW spoke with MOB regarding her THC use, MOB reports she  was smoking marijuana prior to finding out she was pregnant. MOB was educated on hospital drug screening policies due to substance use history, MOB did not have questions or concerns. MOB reported that she ceased use around 4 months of pregnancy. MOB reports this is her second child with the oldest being 21 months old. MOB reports that FOB is involved and his name is [employer]. FOB is the father of the older child as well. MOB reports having a great support system of friends and family. 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 MOB reports she graduated from high school. MOB states she has a new car seat for transportation of infant with knowledge of use and installation of seat. MOB reports that infant will sleep in a bassinet at home, SIDS precautions thoroughly reviewed. MOB reports newborn will receive pediatric care at [facility]. MOB receives [facility] and Medicaid. MOB states she has all baby necessity items at home. MOB denies any concerns or questions at this time.  CSW will continue to monitor chart for results and will make report if warranted.  CSW Plan/Description:  No Further Intervention Required/No Barriers to Discharge, CSW Will Continue to Monitor Umbilical Cord Tissue Drug Screen Results and Make Report if Elsie Ra 02/09/2019, 12:37 PM

## 2019-02-10 MED ORDER — IBUPROFEN 800 MG PO TABS: 800.0000 mg | ORAL_TABLET | Freq: Three times a day (TID) | ORAL | 0 refills | Status: DC

## 2019-02-10 MED ORDER — SENNOSIDES-DOCUSATE SODIUM 8.6-50 MG PO TABS: 2.0000 | ORAL_TABLET | Freq: Every evening | ORAL | Status: DC | PRN

## 2019-02-10 NOTE — Discharge Instructions (Signed)

## 2019-02-11 ENCOUNTER — Encounter: Payer: Self-pay | Admitting: *Deleted

## 2019-03-20 ENCOUNTER — Encounter: Payer: Self-pay | Admitting: Medical

## 2019-03-20 ENCOUNTER — Ambulatory Visit (INDEPENDENT_AMBULATORY_CARE_PROVIDER_SITE_OTHER): Payer: Medicaid Other | Admitting: Medical

## 2019-03-20 ENCOUNTER — Other Ambulatory Visit: Payer: Self-pay

## 2019-03-20 ENCOUNTER — Ambulatory Visit: Payer: Self-pay | Admitting: Medical

## 2019-03-20 DIAGNOSIS — Z30430 Encounter for insertion of intrauterine contraceptive device: Secondary | ICD-10-CM

## 2019-03-20 DIAGNOSIS — Z3040 Encounter for surveillance of contraceptives, unspecified: Secondary | ICD-10-CM

## 2019-03-20 DIAGNOSIS — Z1389 Encounter for screening for other disorder: Secondary | ICD-10-CM

## 2019-03-20 LAB — POCT PREGNANCY, URINE: Preg Test, Ur: NEGATIVE

## 2019-03-20 MED ORDER — LEVONORGESTREL 19.5 MCG/DAY IU IUD
INTRAUTERINE_SYSTEM | Freq: Once | INTRAUTERINE | Status: AC
  Administered 2019-03-20: 11:00:00 via INTRAUTERINE

## 2019-03-20 NOTE — Patient Instructions (Signed)
Intrauterine Device Insertion, Care After  This sheet gives you information about how to care for yourself after your procedure. Your health care provider may also give you more specific instructions. If you have problems or questions, contact your health care provider. What can I expect after the procedure? After the procedure, it is common to have:  Cramps and pain in the abdomen.  Light bleeding (spotting) or heavier bleeding that is like your menstrual period. This may last for up to a few days.  Lower back pain.  Dizziness.  Headaches.  Nausea. Follow these instructions at home:  Before resuming sexual activity, check to make sure that you can feel the IUD string(s). You should be able to feel the end of the string(s) below the opening of your cervix. If your IUD string is in place, you may resume sexual activity. ? If you had a hormonal IUD inserted more than 7 days after your most recent period started, you will need to use a backup method of birth control for 7 days after IUD insertion. Ask your health care provider whether this applies to you.  Continue to check that the IUD is still in place by feeling for the string(s) after every menstrual period, or once a month.  Take over-the-counter and prescription medicines only as told by your health care provider.  Do not drive or use heavy machinery while taking prescription pain medicine.  Keep all follow-up visits as told by your health care provider. This is important. Contact a health care provider if:  You have bleeding that is heavier or lasts longer than a normal menstrual cycle.  You have a fever.  You have cramps or abdominal pain that get worse or do not get better with medicine.  You develop abdominal pain that is new or is not in the same area of earlier cramping and pain.  You feel lightheaded or weak.  You have abnormal or bad-smelling discharge from your vagina.  You have pain during sexual activity.   You have any of the following problems with your IUD string(s): ? The string bothers or hurts you or your sexual partner. ? You cannot feel the string. ? The string has gotten longer.  You can feel the IUD in your vagina.  You think you may be pregnant, or you miss your menstrual period.  You think you may have an STI (sexually transmitted infection). Get help right away if:  You have flu-like symptoms.  You have a fever and chills.  You can feel that your IUD has slipped out of place. Summary  After the procedure, it is common to have cramps and pain in the abdomen. It is also common to have light bleeding (spotting) or heavier bleeding that is like your menstrual period.  Continue to check that the IUD is still in place by feeling for the string(s) after every menstrual period, or once a month.  Keep all follow-up visits as told by your health care provider. This is important.  Contact your health care provider if you have problems with your IUD string(s), such as the string getting longer or bothering you or your sexual partner. This information is not intended to replace advice given to you by your health care provider. Make sure you discuss any questions you have with your health care provider. Document Released: 07/06/2011 Document Revised: 09/28/2016 Document Reviewed: 09/28/2016 Elsevier Interactive Patient Education  2019 [employer]. IUD PLACEMENT POST-PROCEDURE INSTRUCTIONS  1. You may take Ibuprofen, Aleve or Tylenol  for pain if needed.  Cramping should resolve within in 24 hours.  2. You may have a small amount of spotting.  You should wear a mini pad for the next few days.  3. You may have intercourse after 24 hours.  If you using this for birth control, it is effective immediately.  4. You need to call if you have any pelvic pain, fever, heavy bleeding or foul smelling vaginal discharge.  Irregular bleeding is common the first several months after having an IUD  placed. You do not need to call for this reason unless you are concerned.  5. Shower or bathe as normal  6. You should have a follow-up appointment in 4-8 weeks for a re-check to make sure you are not having any problems.

## 2019-03-20 NOTE — Progress Notes (Signed)
Subjective:     Adrienna Soelberg is a 23 y.o. female who presents for a postpartum visit. She is 5 weeks postpartum following a spontaneous vaginal delivery. I have fully reviewed the prenatal and intrapartum course. The delivery was at 39 gestational weeks. Outcome: spontaneous vaginal delivery. Anesthesia: epidural. Postpartum course has been normal. Baby's course has been normal. Baby is feeding by bottle Rush Barer Good start. Bleeding staining only. Bowel function is normal. Bladder function is normal. Patient is not sexually active. Contraception method is none. Postpartum depression screening: negative.  The following portions of the patient's history were reviewed and updated as appropriate: allergies, current medications, past family history, past medical history, past social history, past surgical history and problem list.  Review of Systems Pertinent items are noted in HPI.   Objective:    BP 134/67   Pulse 79   Temp 98.6 F (37 C)   Ht 5\' 3"  (1.6 m)   Wt 259 lb 6.4 oz (117.7 kg)   LMP 05/11/2018 (Exact Date)   Breastfeeding No   BMI 45.95 kg/m   General:  alert and cooperative   Breasts:  deferred, not breastfeeding  Lungs: clear to auscultation bilaterally  Heart:  regular rate and rhythm, S1, S2 normal, no murmur, click, rub or gallop  Abdomen: soft, non-tender; bowel sounds normal; no masses,  no organomegaly   Vulva:  normal  Vagina: normal vagina, no discharge, exudate, lesion, or erythema and scant bleeding  Cervix:  normal contour, small bleeding  Corpus: normal  Adnexa:  not evaluated  Rectal Exam: Not performed.           GYNECOLOGY CLINIC PROCEDURE NOTE  IUD Insertion Procedure Note Patient identified, informed consent performed.  Discussed risks of irregular bleeding, cramping, infection, malpositioning or misplacement of the IUD outside the uterus which may require further procedure such as laparoscopy. Time out was performed.  Urine pregnancy test  negative.  Speculum placed in the vagina.  Cervix visualized.  Cleaned with Betadine x 2.  Grasped anteriorly with a single tooth tenaculum.  Uterus sounded to 7 cm.  Mirena IUD placed per manufacturer's recommendations.  Strings trimmed to 3 cm. Tenaculum was removed, good hemostasis noted.  Patient tolerated procedure well.   Patient was given post-procedure instructions.  She was advised to be have backup contraception for one week.  Patient was also asked to check IUD strings periodically  Assessment:     Normal postpartum exam. Pap smear not done at today's visit.  Last pap was normal in 2019 IUD insertion  Plan:    1. Contraception: IUD 2. Patient advised to use condoms x 1 week and check strings periodically 3. Follow up in: 1 year for annual exam or sooner as needed.    Marny Lowenstein, PA-C 03/20/2019 11:13 AM

## 2019-03-20 NOTE — Addendum Note (Signed)
Addended by: Henrietta Dine on: 03/20/2019 11:20 AM   Modules accepted: Orders

## 2019-03-21 ENCOUNTER — Ambulatory Visit: Payer: Self-pay | Admitting: Obstetrics and Gynecology

## 2019-09-01 ENCOUNTER — Telehealth: Payer: Medicaid Other

## 2019-09-02 ENCOUNTER — Telehealth: Payer: Medicaid Other

## 2020-07-10 ENCOUNTER — Other Ambulatory Visit: Payer: Self-pay

## 2020-07-10 ENCOUNTER — Other Ambulatory Visit: Payer: Medicaid Other

## 2020-07-10 DIAGNOSIS — Z20822 Contact with and (suspected) exposure to covid-19: Secondary | ICD-10-CM

## 2020-07-11 LAB — SARS-COV-2, NAA 2 DAY TAT

## 2020-07-11 LAB — NOVEL CORONAVIRUS, NAA: SARS-CoV-2, NAA: DETECTED — AB

## 2020-07-12 ENCOUNTER — Telehealth: Payer: Self-pay | Admitting: Unknown Physician Specialty

## 2020-07-12 NOTE — Telephone Encounter (Signed)
Called to discuss with patient about Covid symptoms and the use of a monoclonal antibody infusion for those with mild to moderate Covid symptoms and at a high risk of hospitalization.  Pt may be qualified for this infusion   Message left to call back  

## 2021-12-22 ENCOUNTER — Encounter: Payer: Self-pay | Admitting: Family Medicine

## 2022-08-22 ENCOUNTER — Encounter (HOSPITAL_COMMUNITY): Payer: Self-pay

## 2022-08-22 ENCOUNTER — Ambulatory Visit (HOSPITAL_COMMUNITY)
Admission: EM | Admit: 2022-08-22 | Discharge: 2022-08-22 | Disposition: A | Discharge status: Home / Self Care | Payer: Medicaid Other | Attending: Family Medicine | Admitting: Family Medicine

## 2022-08-22 DIAGNOSIS — N765 Ulceration of vagina: Secondary | ICD-10-CM | POA: Diagnosis not present

## 2022-08-22 DIAGNOSIS — N898 Other specified noninflammatory disorders of vagina: Secondary | ICD-10-CM | POA: Insufficient documentation

## 2022-08-22 DIAGNOSIS — L731 Pseudofolliculitis barbae: Secondary | ICD-10-CM | POA: Diagnosis not present

## 2022-08-22 LAB — HIV ANTIBODY (ROUTINE TESTING W REFLEX): HIV Screen 4th Generation wRfx: NONREACTIVE

## 2022-08-22 LAB — HEPATITIS C ANTIBODY: HCV Ab: NONREACTIVE

## 2022-08-22 MED ORDER — VALACYCLOVIR HCL 1 G PO TABS: 1000.0000 mg | ORAL_TABLET | Freq: Two times a day (BID) | ORAL | 0 refills | Status: AC

## 2022-08-22 NOTE — ED Triage Notes (Signed)
Pt is here for possible ingrown hair on lip of the vagina causing pain and discomfort x3days

## 2022-08-22 NOTE — ED Provider Notes (Signed)
[place]    CSN: [id number] Arrival date & time: 08/22/22  1454      History   Chief Complaint Chief Complaint  Patient presents with   Groin Swelling    HPI Jeanette [name] is a 26 y.o. female.   Patient is here for a possible ingrown hair at the pubic area.  She noted a bump on the outside of the vaginal area on Friday, and then noted a bump on the "inside" the next day.  Some vaginal irritation, some d/c, but that is normal for her.         Past Medical History:  Diagnosis Date   Medical history non-contributory    Obesity     Patient Active Problem List   Diagnosis Date Noted   Low TSH level 08/28/2018   History of shoulder dystocia in prior pregnancy 08/08/2018   BMI 45.0-49.9, adult ([facility]) 08/08/2018   History of gestational hypertension 03/10/2018    Past Surgical History:  Procedure Laterality Date   NO PAST SURGERIES      OB History     Gravida  2   Para  2   Term  2   Preterm  0   AB  0   Living  2      SAB  0   IAB  0   Ectopic  0   Multiple  0   Live Births  2            Home Medications    Prior to Admission medications   Medication Sig Start Date End Date Taking? Authorizing Provider  ibuprofen (ADVIL,MOTRIN) 800 MG tablet Take 1 tablet (800 mg total) by mouth 3 (three) times daily. 02/10/19   Glenice Bow, DO  Prenatal Multivit-Min-Fe-FA (PRENATAL VITAMINS) 0.8 MG tablet Take 1 tablet by mouth daily. 07/17/18   Luvenia Redden, PA-C  senna-docusate (SENOKOT-S) 8.6-50 MG tablet Take 2 tablets by mouth at bedtime as needed for mild constipation. Patient not taking: Reported on 03/20/2019 02/10/19   Glenice Bow, DO    Family History Family History  Problem Relation Age of Onset   Healthy Mother    Healthy Father     Social History Social History   Tobacco Use   Smoking status: Former    Types: Cigarettes    Quit date: 07/15/2017    Years since quitting: 5.1   Smokeless tobacco:  Never  Vaping Use   Vaping Use: Never used  Substance Use Topics   Alcohol use: Never   Drug use: Not Currently    Types: Marijuana    Comment: stopped 09/01//2019     Allergies   Patient has no known allergies.   Review of Systems Review of Systems  Constitutional: Negative.   HENT: Negative.    Respiratory: Negative.    Cardiovascular: Negative.   Genitourinary:  Positive for genital sores, pelvic pain and vaginal discharge.  Musculoskeletal: Negative.   Neurological: Negative.   Psychiatric/Behavioral: Negative.       Physical Exam Triage Vital Signs ED Triage Vitals  Enc Vitals Group     BP 08/22/22 1512 128/76     Pulse Rate 08/22/22 1512 82     Resp 08/22/22 1512 12     Temp 08/22/22 1512 98.7 F (37.1 C)     Temp Source 08/22/22 1512 Oral     SpO2 08/22/22 1512 97 %     Weight 08/22/22 1509 206 lb (93.4 kg)  Height 08/22/22 1509 5\' 3"  (1.6 m)     Head Circumference --      Peak Flow --      Pain Score 08/22/22 1508 10     Pain Loc --      Pain Edu? --      Excl. in [place]? --    No data found.  Updated Vital Signs BP 128/76 (BP Location: Left Arm)   Pulse 82   Temp 98.7 F (37.1 C) (Oral)   Resp 12   Ht 5\' 3"  (1.6 m)   Wt 93.4 kg   LMP  (LMP Unknown)   SpO2 97%   BMI 36.49 kg/m   Visual Acuity Right Eye Distance:   Left Eye Distance:   Bilateral Distance:    Right Eye Near:   Left Eye Near:    Bilateral Near:     Physical Exam Constitutional:      Appearance: Normal appearance.  Cardiovascular:     Rate and Rhythm: Normal rate.  Pulmonary:     Effort: Pulmonary effort is normal.  Genitourinary:    Comments: At the right labia is a small bump due to ingrown hair;  no redness warmth or drainage.   White vaginal d/c is noted on exam.   At the right medial inferior labia are two ulcerations;  one is very small, the other is larger and more tender;   Musculoskeletal:     Cervical back: Normal range of motion.  Skin:    General:  Skin is warm.  Neurological:     General: No focal deficit present.     Mental Status: She is alert.  Psychiatric:        Mood and Affect: Mood normal.      UC Treatments / Results  Labs (all labs ordered are listed, but only abnormal results are displayed) Labs Reviewed  HSV CULTURE AND TYPING  HIV ANTIBODY (ROUTINE TESTING W REFLEX)  RPR  HEPATITIS C ANTIBODY  CERVICOVAGINAL ANCILLARY ONLY    EKG   Radiology No results found.  Procedures Procedures (including critical care time)  Medications Ordered in UC Medications - No data to display  Initial Impression / Assessment and Plan / UC Course  I have reviewed the triage vital signs and the nursing notes.  Pertinent labs & imaging results that were available during my care of the patient were reviewed by me and considered in my medical decision making (see chart for details).  Patient was seen today for vaginal symptoms.   Long discussion today about ulcers noted on exam.  Patient was visibly upset and tearful about the possibility of herpes.  Will treat today for possible herpes as this is day #3 of her symptoms.  Will await cultures and lab work and she will be notified of results tomorrow.  She is aware and agrees.   Final Clinical Impressions(s) / UC Diagnoses   Final diagnoses:  Vaginal discharge  Ingrown hair  Vaginal ulcer     Discharge Instructions      You were seen today for vaginal issues.  The ingrown hair will resolve.  You could trial warm compresses to help that.  As discussed, I am concerned about the ulcerations.  I have sent out valtrex for possible herpes infection.  Please start this ASAP.  The herpes swab, lab work, and vaginal swab will be resulted tomorrow and we will call you with any positive results.  Please avoid intercourse until treatment is complete.  ED Prescriptions     Medication Sig Dispense Auth. Provider   valACYclovir (VALTREX) 1000 MG tablet Take 1 tablet (1,000 mg  total) by mouth 2 (two) times daily for 7 days. 14 tablet [name], MD      PDMP not reviewed this encounter.   [name], MD 08/22/22 1556

## 2022-08-22 NOTE — Discharge Instructions (Addendum)
You were seen today for vaginal issues.  The ingrown hair will resolve.  You could trial warm compresses to help that.  As discussed, I am concerned about the ulcerations.  I have sent out valtrex for possible herpes infection.  Please start this ASAP.  The herpes swab, lab work, and vaginal swab will be resulted tomorrow and we will call you with any positive results.  Please avoid intercourse until treatment is complete.

## 2022-08-23 ENCOUNTER — Telehealth (HOSPITAL_COMMUNITY): Payer: Self-pay | Admitting: Emergency Medicine

## 2022-08-23 ENCOUNTER — Ambulatory Visit (HOSPITAL_COMMUNITY)
Admission: EM | Admit: 2022-08-23 | Discharge: 2022-08-23 | Disposition: A | Discharge status: Home / Self Care | Payer: Medicaid Other | Attending: Emergency Medicine | Admitting: Emergency Medicine

## 2022-08-23 DIAGNOSIS — Z113 Encounter for screening for infections with a predominantly sexual mode of transmission: Secondary | ICD-10-CM | POA: Diagnosis not present

## 2022-08-23 DIAGNOSIS — Z202 Contact with and (suspected) exposure to infections with a predominantly sexual mode of transmission: Secondary | ICD-10-CM

## 2022-08-23 LAB — CERVICOVAGINAL ANCILLARY ONLY
Bacterial Vaginitis (gardnerella): POSITIVE — AB
Candida Glabrata: NEGATIVE
Candida Vaginitis: POSITIVE — AB
Chlamydia: POSITIVE — AB
Comment: NEGATIVE
Comment: NEGATIVE
Comment: NEGATIVE
Comment: NEGATIVE
Comment: NEGATIVE
Comment: NORMAL
Neisseria Gonorrhea: NEGATIVE
Trichomonas: NEGATIVE

## 2022-08-23 LAB — RPR: RPR Ser Ql: NONREACTIVE

## 2022-08-23 MED ORDER — FLUCONAZOLE 150 MG PO TABS: 150.0000 mg | ORAL_TABLET | Freq: Once | ORAL | 0 refills | Status: AC

## 2022-08-23 MED ORDER — METRONIDAZOLE 500 MG PO TABS: 500.0000 mg | ORAL_TABLET | Freq: Two times a day (BID) | ORAL | 0 refills | Status: DC

## 2022-08-23 MED ORDER — DOXYCYCLINE HYCLATE 100 MG PO CAPS: 100.0000 mg | ORAL_CAPSULE | Freq: Two times a day (BID) | ORAL | 0 refills | Status: AC

## 2022-08-23 NOTE — Discharge Instructions (Signed)
We will call you if anything on your swab returns positive. Please abstain from sexual intercourse until your results return.  If you decide to start taking the medication, take with food. Avoid alcohol while using the medications as this can make you very sick.

## 2022-08-23 NOTE — ED Triage Notes (Signed)
Pt requesting STD retesting prior to taking meds.

## 2022-08-23 NOTE — ED Provider Notes (Signed)
[facility]    CSN: [id number] Arrival date & time: 08/23/22  1910     History   Chief Complaint Chief Complaint  Patient presents with   SEXUALLY TRANSMITTED DISEASE    HPI Jeanette [name] is a 26 y.o. female.  Presents for STD retesting Seen here yesterday, cytology swab returned positive for chlamydia, BV, yeast. RN had discussed with patient over phone, patient stated she did not believe the results and wanted to return for retesting  Additionally awaiting HSV swab result Patient has started valacyclovir Wanted retest before starting flagyl/doxy/flucon  Past Medical History:  Diagnosis Date   Medical history non-contributory    Obesity     Patient Active Problem List   Diagnosis Date Noted   Low TSH level 08/28/2018   History of shoulder dystocia in prior pregnancy 08/08/2018   BMI 45.0-49.9, adult ([facility]) 08/08/2018   History of gestational hypertension 03/10/2018    Past Surgical History:  Procedure Laterality Date   NO PAST SURGERIES      OB History     Gravida  2   Para  2   Term  2   Preterm  0   AB  0   Living  2      SAB  0   IAB  0   Ectopic  0   Multiple  0   Live Births  2            Home Medications    Prior to Admission medications   Medication Sig Start Date End Date Taking? Authorizing Provider  doxycycline (VIBRAMYCIN) 100 MG capsule Take 1 capsule (100 mg total) by mouth 2 (two) times daily for 7 days. 08/23/22 08/30/22  Merrilee Jansky, MD  fluconazole (DIFLUCAN) 150 MG tablet Take 1 tablet (150 mg total) by mouth once for 1 dose. Repeat in 3 days if symptoms persist 08/23/22 08/23/22  Merrilee Jansky, MD  ibuprofen (ADVIL,MOTRIN) 800 MG tablet Take 1 tablet (800 mg total) by mouth 3 (three) times daily. 02/10/19   Tamera Stands, DO  metroNIDAZOLE (FLAGYL) 500 MG tablet Take 1 tablet (500 mg total) by mouth 2 (two) times daily. 08/23/22   LampteyBritta Mccreedy, MD  Prenatal Multivit-Min-Fe-FA (PRENATAL  VITAMINS) 0.8 MG tablet Take 1 tablet by mouth daily. 07/17/18   Marny Lowenstein, PA-C  senna-docusate (SENOKOT-S) 8.6-50 MG tablet Take 2 tablets by mouth at bedtime as needed for mild constipation. Patient not taking: Reported on 03/20/2019 02/10/19   Tamera Stands, DO  valACYclovir (VALTREX) 1000 MG tablet Take 1 tablet (1,000 mg total) by mouth 2 (two) times daily for 7 days. 08/22/22 08/29/22  [name], MD    Family History Family History  Problem Relation Age of Onset   Healthy Mother    Healthy Father     Social History Social History   Tobacco Use   Smoking status: Former    Types: Cigarettes    Quit date: 07/15/2017    Years since quitting: 5.1   Smokeless tobacco: Never  Vaping Use   Vaping Use: Never used  Substance Use Topics   Alcohol use: Never   Drug use: Not Currently    Types: Marijuana    Comment: stopped 09/01//2019     Allergies   Patient has no known allergies.   Review of Systems Review of Systems Per HPI  Physical Exam Triage Vital Signs ED Triage Vitals  Enc Vitals Group     BP  Pulse      Resp      Temp      Temp src      SpO2      Weight      Height      Head Circumference      Peak Flow      Pain Score      Pain Loc      Pain Edu?      Excl. in [place]?    No data found.  Updated Vital Signs BP 134/83 (BP Location: Left Arm)   Pulse 69   Temp 98 F (36.7 C) (Oral)   Resp 18   LMP  (LMP Unknown)   SpO2 98%    Physical Exam Vitals and nursing note reviewed.  Constitutional:      General: She is not in acute distress.    Appearance: Normal appearance.  HENT:     Mouth/Throat:     Pharynx: Oropharynx is clear.  Cardiovascular:     Rate and Rhythm: Normal rate and regular rhythm.     Pulses: Normal pulses.  Pulmonary:     Effort: Pulmonary effort is normal.  Abdominal:     Palpations: Abdomen is soft.  Neurological:     Mental Status: She is alert and oriented to person, place, and time.     UC  Treatments / Results  Labs (all labs ordered are listed, but only abnormal results are displayed) Labs Reviewed  CERVICOVAGINAL ANCILLARY ONLY    EKG  Radiology No results found.  Procedures Procedures (including critical care time)  Medications Ordered in UC Medications - No data to display  Initial Impression / Assessment and Plan / UC Course  I have reviewed the triage vital signs and the nursing notes.  Pertinent labs & imaging results that were available during my care of the patient were reviewed by me and considered in my medical decision making (see chart for details).  Re-swab pending. Discussed treatment Work note provided. Return precautions discussed. Patient agrees to plan  Final Clinical Impressions(s) / UC Diagnoses   Final diagnoses:  Screening for STD (sexually transmitted disease)     Discharge Instructions      We will call you if anything on your swab returns positive. Please abstain from sexual intercourse until your results return.  If you decide to start taking the medication, take with food. Avoid alcohol while using the medications as this can make you very sick.     ED Prescriptions   None    PDMP not reviewed this encounter.   Annai Heick, Vernice Jefferson 08/23/22 1950

## 2022-08-24 LAB — CERVICOVAGINAL ANCILLARY ONLY
Bacterial Vaginitis (gardnerella): NEGATIVE
Candida Glabrata: NEGATIVE
Candida Vaginitis: POSITIVE — AB
Chlamydia: POSITIVE — AB
Comment: NEGATIVE
Comment: NEGATIVE
Comment: NEGATIVE
Comment: NEGATIVE
Comment: NEGATIVE
Comment: NORMAL
Neisseria Gonorrhea: NEGATIVE
Trichomonas: NEGATIVE

## 2022-08-25 LAB — HSV CULTURE AND TYPING

## 2023-06-27 ENCOUNTER — Other Ambulatory Visit: Payer: Self-pay

## 2023-06-27 ENCOUNTER — Emergency Department (HOSPITAL_COMMUNITY)
Admission: EM | Admit: 2023-06-27 | Discharge: 2023-06-27 | Disposition: A | Discharge status: Home / Self Care | Payer: Medicaid Other | Attending: Emergency Medicine | Admitting: Emergency Medicine

## 2023-06-27 ENCOUNTER — Encounter (HOSPITAL_COMMUNITY): Payer: Self-pay

## 2023-06-27 DIAGNOSIS — N760 Acute vaginitis: Secondary | ICD-10-CM | POA: Insufficient documentation

## 2023-06-27 DIAGNOSIS — R110 Nausea: Secondary | ICD-10-CM | POA: Diagnosis not present

## 2023-06-27 DIAGNOSIS — B9689 Other specified bacterial agents as the cause of diseases classified elsewhere: Secondary | ICD-10-CM | POA: Diagnosis not present

## 2023-06-27 DIAGNOSIS — R42 Dizziness and giddiness: Secondary | ICD-10-CM

## 2023-06-27 LAB — URINALYSIS, ROUTINE W REFLEX MICROSCOPIC
Bilirubin Urine: NEGATIVE
Glucose, UA: NEGATIVE mg/dL
Ketones, ur: NEGATIVE mg/dL
Leukocytes,Ua: NEGATIVE
Nitrite: NEGATIVE
Protein, ur: NEGATIVE mg/dL
Specific Gravity, Urine: 1.017 (ref 1.005–1.030)
pH: 5 (ref 5.0–8.0)

## 2023-06-27 LAB — COMPREHENSIVE METABOLIC PANEL
ALT: 14 U/L (ref 0–44)
AST: 22 U/L (ref 15–41)
Albumin: 3.8 g/dL (ref 3.5–5.0)
Alkaline Phosphatase: 47 U/L (ref 38–126)
Anion gap: 9 (ref 5–15)
BUN: 18 mg/dL (ref 6–20)
CO2: 19 mmol/L — ABNORMAL LOW (ref 22–32)
Calcium: 8.9 mg/dL (ref 8.9–10.3)
Chloride: 107 mmol/L (ref 98–111)
Creatinine, Ser: 0.78 mg/dL (ref 0.44–1.00)
GFR, Estimated: >60 mL/min (ref >60)
Glucose, Bld: 87 mg/dL (ref 70–99)
Potassium: 4.3 mmol/L (ref 3.5–5.1)
Sodium: 135 mmol/L (ref 135–145)
Total Bilirubin: 0.4 mg/dL (ref 0.3–1.2)
Total Protein: 7.5 g/dL (ref 6.5–8.1)

## 2023-06-27 LAB — CBC
HCT: 42.5 % (ref 36.0–46.0)
Hemoglobin: 13.6 g/dL (ref 12.0–15.0)
MCH: 30.9 pg (ref 26.0–34.0)
MCHC: 32 g/dL (ref 30.0–36.0)
MCV: 96.6 fL (ref 80.0–100.0)
Platelets: 221 10*3/uL (ref 150–400)
RBC: 4.4 MIL/uL (ref 3.87–5.11)
RDW: 13.2 % (ref 11.5–15.5)
WBC: 5.7 10*3/uL (ref 4.0–10.5)
nRBC: 0 % (ref 0.0–0.2)

## 2023-06-27 LAB — WET PREP, GENITAL
Sperm: NONE SEEN
Trich, Wet Prep: NONE SEEN
WBC, Wet Prep HPF POC: <10 (ref <10)
Yeast Wet Prep HPF POC: NONE SEEN

## 2023-06-27 LAB — LIPASE, BLOOD: Lipase: 37 U/L (ref 11–51)

## 2023-06-27 LAB — HCG, SERUM, QUALITATIVE: Preg, Serum: NEGATIVE

## 2023-06-27 MED ORDER — OXYCODONE-ACETAMINOPHEN 5-325 MG PO TABS
1.0000 | ORAL_TABLET | Freq: Once | ORAL | Status: AC
  Administered 2023-06-27: 1 via ORAL
  Filled 2023-06-27: qty 1

## 2023-06-27 MED ORDER — MECLIZINE HCL 25 MG PO TABS: 25.0000 mg | ORAL_TABLET | Freq: Three times a day (TID) | ORAL | 0 refills | Status: DC | PRN

## 2023-06-27 MED ORDER — ONDANSETRON 4 MG PO TBDP: 4.0000 mg | ORAL_TABLET | Freq: Three times a day (TID) | ORAL | 0 refills | Status: DC | PRN

## 2023-06-27 MED ORDER — ONDANSETRON 4 MG PO TBDP
4.0000 mg | ORAL_TABLET | Freq: Once | ORAL | Status: AC
  Administered 2023-06-27: 4 mg via ORAL
  Filled 2023-06-27: qty 1

## 2023-06-27 MED ORDER — METRONIDAZOLE 500 MG PO TABS: 500.0000 mg | ORAL_TABLET | Freq: Two times a day (BID) | ORAL | 0 refills | Status: DC

## 2023-06-27 NOTE — ED Triage Notes (Signed)
Patient reports dizziness x 1 week and RLQ pain and nausea x 2 days. Patient denies fevers, vomiting, and diarrhea. VSS and NAD

## 2023-06-27 NOTE — ED Provider Notes (Signed)
[facility] Provider Note   CSN: [id number] Arrival date & time: 06/27/23  4098     History  Chief Complaint  Patient presents with   Abdominal Pain   Dizziness    Jeanette [name] is a 27 y.o. female otherwise healthy presenting today for evaluation of dizziness and abdominal pain.  States she has had intermittent dizziness in the last 1 week.  She describes it as a combination of lightheadedness and room spinning sensation.  Denies any vision changes. States got worse when she had increased work load at work.  She denies any recent fall or head injury.  Patient also complains of intermittent abdominal pain started 2 days ago.  The pain is located in the right lower quadrant, intermittent, sharp, rating to her back.  She endorses nausea without vomiting.  She denies any constipation, diarrhea, blood in the stool or urine, urinary symptoms, abnormal vaginal discharge.  Patient is sexually active.   Abdominal Pain Dizziness     Past Medical History:  Diagnosis Date   Medical history non-contributory    Obesity    Past Surgical History:  Procedure Laterality Date   NO PAST SURGERIES       Home Medications Prior to Admission medications   Medication Sig Start Date End Date Taking? Authorizing Provider  ibuprofen (ADVIL,MOTRIN) 800 MG tablet Take 1 tablet (800 mg total) by mouth 3 (three) times daily. 02/10/19   Tamera Stands, DO  metroNIDAZOLE (FLAGYL) 500 MG tablet Take 1 tablet (500 mg total) by mouth 2 (two) times daily. 08/23/22   LampteyBritta Mccreedy, MD  Prenatal Multivit-Min-Fe-FA (PRENATAL VITAMINS) 0.8 MG tablet Take 1 tablet by mouth daily. 07/17/18   Marny Lowenstein, PA-C  senna-docusate (SENOKOT-S) 8.6-50 MG tablet Take 2 tablets by mouth at bedtime as needed for mild constipation. Patient not taking: Reported on 03/20/2019 02/10/19   Tamera Stands, DO      Allergies    Patient has no known allergies.    Review of  Systems   Review of Systems  Gastrointestinal:  Positive for abdominal pain.  Neurological:  Positive for dizziness.    Physical Exam Updated Vital Signs BP (!) 143/94 (BP Location: Right Arm)   Pulse 73   Temp 99.1 F (37.3 C) (Oral)   Resp 16   Ht 5\' 3"  (1.6 m)   Wt 95.3 kg   SpO2 99%   BMI 37.20 kg/m  Physical Exam Vitals and nursing note reviewed.  Constitutional:      Appearance: Normal appearance.  HENT:     Head: Normocephalic and atraumatic.     Mouth/Throat:     Mouth: Mucous membranes are moist.  Eyes:     General: No scleral icterus. Cardiovascular:     Rate and Rhythm: Normal rate and regular rhythm.     Pulses: Normal pulses.     Heart sounds: Normal heart sounds.  Pulmonary:     Effort: Pulmonary effort is normal.     Breath sounds: Normal breath sounds.  Abdominal:     General: Abdomen is flat.     Palpations: Abdomen is soft.     Tenderness: There is no abdominal tenderness.  Musculoskeletal:        General: No deformity.  Skin:    General: Skin is warm.     Findings: No rash.  Neurological:     General: No focal deficit present.     Mental Status: She is alert.  Comments: Cranial nerves II through XII intact. Intact sensation to light touch in all 4 extremities. 5/5 strength in all 4 extremities. Intact finger-to-nose and heel-to-shin of all 4 extremities. No visual field cuts. No neglect noted. No aphasia noted.  Psychiatric:        Mood and Affect: Mood normal.     ED Results / Procedures / Treatments   Labs (all labs ordered are listed, but only abnormal results are displayed) Labs Reviewed  COMPREHENSIVE METABOLIC PANEL - Abnormal; Notable for the following components:      Result Value   CO2 19 (*)    All other components within normal limits  URINALYSIS, ROUTINE W REFLEX MICROSCOPIC - Abnormal; Notable for the following components:   Hgb urine dipstick MODERATE (*)    Bacteria, UA RARE (*)    All other components within normal  limits  LIPASE, BLOOD  CBC  HCG, SERUM, QUALITATIVE    EKG None  Radiology No results found.  Procedures Procedures    Medications Ordered in ED Medications - No data to display  ED Course/ Medical Decision Making/ A&P                                 Medical Decision Making Amount and/or Complexity of Data Reviewed Labs: ordered.  Risk Prescription drug management.   This patient presents to the ED for abdominal pain, dizziness, this involves an extensive number of treatment options, and is a complaint that carries with a high risk of complications and morbidity.  The differential diagnosis includes appendicitis, cholecystitis, cholelithiasis, kidney stones, UTI, pyelonephritis, ovarian torsion, ectopic pregnancy, PID/TOA, tension type headache, vertigo, migraines, CVA, infectious etiology.  This is not an exhaustive list.  Lab tests: I ordered and personally interpreted labs.  The pertinent results include: WBC unremarkable. Hbg unremarkable. Platelets unremarkable. Electrolytes unremarkable. BUN, creatinine unremarkable.  Wet prep was positive for BV.  GC/chlamydia ordered and pending.  Problem list/ ED course/ Critical interventions/ Medical management: HPI: See above Vital signs within normal range and stable throughout visit. Laboratory/imaging studies significant for: See above. On physical examination, patient is afebrile and appears in no acute distress.  Abdominal exam without peritoneal signs.  No evidence of acute abdomen at this time.  No abdominal tenderness on my exam.  No imaging is indicated at this point.  CBC with no evidence of leukocytosis or anemia.  CMP with no acute electrolyte abnormalities or AKI.  Lipase is normal.  Pregnancy test is negative.  Well-appearing.  Patient with pelvic done with NT present throughout examination with no CMT, adnexal tenderness or vaginal discharge concerning for PID or TOA.  Wet prep was positive for BV.  Considered and  doubt ovarian torsion given history and presentation.  Given workup I have low suspicion for any acute hepatobiliary disease, acute appendicitis, bowel obstruction.  Patient also complains of dizziness that started yesterday.  She denies any recent fall or head injury.  Neuroexam with no focal neurological deficit.  Given clinical picture, I have low suspicion for CVA/TIA.  Will trial course of meclizine for dizziness.  Patient only complains of mild headache at this point.  She is stable for discharge.  Given Percocet for pain and Zofran for nausea.  Reevaluation of patient after this medication showed that symptom improved. Advised patient to follow-up with her primary care physician for reevaluation.  Strict return precaution discussed. I have reviewed the patient home medicines and  have made adjustments as needed.  Cardiac monitoring/EKG: The patient was maintained on a cardiac monitor.  I personally reviewed and interpreted the cardiac monitor which showed an underlying rhythm of: sinus rhythm.  Additional history obtained: External records from outside source obtained and reviewed including: Chart review including previous notes, labs, imaging.  Consultations obtained:  Disposition Continued outpatient therapy. Follow-up with PCP recommended for reevaluation of symptoms. Treatment plan discussed with patient.  Pt acknowledged understanding was agreeable to the plan. Worrisome signs and symptoms were discussed with patient, and patient acknowledged understanding to return to the ED if they noticed these signs and symptoms. Patient was stable upon discharge.   This chart was dictated using voice recognition software.  Despite best efforts to proofread,  errors can occur which can change the documentation meaning.          Final Clinical Impression(s) / ED Diagnoses Final diagnoses:  BV (bacterial vaginosis)  Dizziness    Rx / DC Orders ED Discharge Orders          Ordered     meclizine (ANTIVERT) 25 MG tablet  3 times daily PRN        06/27/23 1613    ondansetron (ZOFRAN-ODT) 4 MG disintegrating tablet  Every 8 hours PRN        06/27/23 1613    metroNIDAZOLE (FLAGYL) 500 MG tablet  2 times daily        06/27/23 1614              Jeanelle Malling, [place] 06/27/23 1621    Ernie Avena, MD 06/27/23 1646

## 2023-06-27 NOTE — Discharge Instructions (Addendum)
Please take your medications as prescribed. Take tylenol/ibuprofen for pain. I recommend close follow-up with PCP for reevaluation.  Please do not hesitate to return to emergency department if worrisome signs symptoms we discussed become apparent.

## 2023-08-21 ENCOUNTER — Encounter: Payer: Self-pay | Admitting: Advanced Practice Midwife

## 2023-08-21 ENCOUNTER — Ambulatory Visit: Payer: Medicaid Other | Admitting: Advanced Practice Midwife

## 2023-08-21 ENCOUNTER — Other Ambulatory Visit (HOSPITAL_COMMUNITY)
Admission: RE | Admit: 2023-08-21 | Discharge: 2023-08-21 | Disposition: A | Discharge status: Home / Self Care | Payer: Medicaid Other | Source: Ambulatory Visit | Attending: Advanced Practice Midwife | Admitting: Advanced Practice Midwife

## 2023-08-21 VITALS — BP 126/79 | HR 67 | Ht 63.0 in | Wt 208.3 lb

## 2023-08-21 DIAGNOSIS — Z113 Encounter for screening for infections with a predominantly sexual mode of transmission: Secondary | ICD-10-CM

## 2023-08-21 DIAGNOSIS — Z124 Encounter for screening for malignant neoplasm of cervix: Secondary | ICD-10-CM

## 2023-08-21 DIAGNOSIS — R202 Paresthesia of skin: Secondary | ICD-10-CM | POA: Diagnosis not present

## 2023-08-21 DIAGNOSIS — Z01419 Encounter for gynecological examination (general) (routine) without abnormal findings: Secondary | ICD-10-CM | POA: Diagnosis not present

## 2023-08-21 DIAGNOSIS — R200 Anesthesia of skin: Secondary | ICD-10-CM

## 2023-08-21 DIAGNOSIS — M5442 Lumbago with sciatica, left side: Secondary | ICD-10-CM

## 2023-08-21 DIAGNOSIS — G8929 Other chronic pain: Secondary | ICD-10-CM | POA: Diagnosis not present

## 2023-08-21 DIAGNOSIS — M5441 Lumbago with sciatica, right side: Secondary | ICD-10-CM | POA: Diagnosis not present

## 2023-08-21 DIAGNOSIS — Z30432 Encounter for removal of intrauterine contraceptive device: Secondary | ICD-10-CM | POA: Diagnosis not present

## 2023-08-21 NOTE — Progress Notes (Signed)
Pt presents for IUD removal. IUD placed 2020. Pt c/o headaches. Declines BC today. Interested in non hormonal options.  Last PAP 08/2018. Pt requesting PAP and STD testing

## 2023-08-21 NOTE — Progress Notes (Signed)
Subjective:     Jeanette [name] is a 27 y.o. female here at [facility] for a routine exam.  Current complaints: chronic back pain and tingling/numbness of legs when lying down plus recent episodes of dizziness and near syncope .  Personal health history reviewed: yes.  Do you have a primary care provider? no Do you feel safe at home? yes  Flowsheet Row Routine Prenatal from 02/04/2019 in [facility]  PHQ-2 Total Score 0       Health Maintenance Due  Topic Date Due   HPV VACCINES (1 - 3-dose series) Never done   Cervical Cancer Screening (Pap smear)  08/22/2021   INFLUENZA VACCINE  06/22/2023   COVID-19 Vaccine (1 - 2023-24 season) Never done     Risk factors for chronic health problems: Smoking: Alchohol/how much: Pt BMI: Body mass index is 36.9 kg/m.   Gynecologic History No LMP recorded. (Menstrual status: IUD). Contraception: IUD Last Pap: 2019. Results were: normal Last mammogram: n/a.   Obstetric History OB History  Gravida Para Term Preterm AB Living  2 2 2  0 0 2  SAB IAB Ectopic Multiple Live Births  0 0 0 0 2    # Outcome Date GA Lbr Len/2nd Weight Sex Type Anes PTL Lv  2 Term 02/08/19 [redacted]w[redacted]d 04:20 7 lb 8.8 oz (3.425 kg) M Vag-Spont EPI  LIV     Birth Comments: [id number] [id number]  1 Term 03/11/18 [redacted]w[redacted]d  7 lb 6.5 oz (3.359 kg) F Vag-Spont EPI  LIV     The following portions of the patient's history were reviewed and updated as appropriate: allergies, current medications, past family history, past medical history, past social history, past surgical history, and problem list.  Review of Systems Pertinent items noted in HPI and remainder of comprehensive ROS otherwise negative.    Objective:  BP 126/79   Pulse 67   Ht 5\' 3"  (1.6 m)   Wt 208 lb 4.8 oz (94.5 kg)   BMI 36.90 kg/m   VS reviewed, nursing note reviewed,  Constitutional: well developed, well nourished, no distress HEENT: normocephalic, thyroid without enlargement  or mass HEART: RRR, no murmurs rubs/gallops RESP: clear and equal to auscultation bilaterally in all lobes  Breast Exam: exam performed: right breast normal without mass, skin or nipple changes or axillary nodes, left breast normal without mass, skin or nipple changes or axillary nodes Abdomen: soft Neuro: alert and oriented x 3 Skin: warm, dry Psych: affect normal Pelvic exam: Performed: Cervix pink, visually closed, without lesion, scant white creamy discharge, vaginal walls and external genitalia normal Bimanual exam: Cervix 0/long/high, firm, anterior, neg CMT, uterus nontender, nonenlarged, adnexa without tenderness, enlargement, or mass         IUD Removal  Patient was in the dorsal lithotomy position, normal external genitalia was noted.  A speculum was placed in the patient's vagina, normal discharge was noted, no lesions. The multiparous cervix was visualized, no lesions, no abnormal discharge.  The strings of the IUD were grasped and pulled using ring forceps. The IUD was removed in its entirety. Patient tolerated the procedure well.    Patient will use abstinence and/or condoms for contraception until return to the office in 4-6 weeks.  Routine preventative health maintenance measures emphasized.  Assessment/Plan:   1. Screening examination for STI  - Cervicovaginal ancillary only( [place]) - HIV antibody (with reflex) - RPR - Hepatitis B Surface AntiGEN - Hepatitis C Antibody  2. Screening for cervical cancer  -  Cytology - PAP( [place])  3. Encounter for annual routine gynecological examination   4. Numbness and tingling of both legs --since delivery in 2020, happened with first baby but resolved before second baby (11 months later) - AMB referral to orthopedics  5. Chronic bilateral low back pain with bilateral sciatica --Pain since delivery in 2020 - AMB referral to orthopedics  6. Encounter for IUD removal --IUD removed without difficulty, see  procedure note above    Return in about 4 weeks (around 09/18/2023) for Gyn follow up for contraception.   Sharen Counter, CNM 3:31 PM

## 2023-08-22 LAB — HEPATITIS C ANTIBODY: Hep C Virus Ab: NONREACTIVE

## 2023-08-22 LAB — CERVICOVAGINAL ANCILLARY ONLY
Chlamydia: NEGATIVE
Comment: NEGATIVE
Comment: NEGATIVE
Comment: NORMAL
Neisseria Gonorrhea: NEGATIVE
Trichomonas: NEGATIVE

## 2023-08-22 LAB — HEPATITIS B SURFACE ANTIGEN: Hepatitis B Surface Ag: NEGATIVE

## 2023-08-22 LAB — HIV ANTIBODY (ROUTINE TESTING W REFLEX): HIV Screen 4th Generation wRfx: NONREACTIVE

## 2023-08-22 LAB — RPR: RPR Ser Ql: NONREACTIVE

## 2023-08-28 LAB — CYTOLOGY - PAP
Comment: NEGATIVE
Diagnosis: UNDETERMINED — AB
High risk HPV: POSITIVE — AB

## 2023-09-01 ENCOUNTER — Encounter: Payer: Self-pay | Admitting: Physical Medicine and Rehabilitation

## 2023-09-01 ENCOUNTER — Other Ambulatory Visit (INDEPENDENT_AMBULATORY_CARE_PROVIDER_SITE_OTHER): Payer: Medicaid Other

## 2023-09-01 ENCOUNTER — Ambulatory Visit: Payer: Medicaid Other | Admitting: Physical Medicine and Rehabilitation

## 2023-09-01 VITALS — BP 125/79 | HR 76

## 2023-09-01 DIAGNOSIS — M7918 Myalgia, other site: Secondary | ICD-10-CM

## 2023-09-01 DIAGNOSIS — G8929 Other chronic pain: Secondary | ICD-10-CM

## 2023-09-01 DIAGNOSIS — M5416 Radiculopathy, lumbar region: Secondary | ICD-10-CM | POA: Diagnosis not present

## 2023-09-01 DIAGNOSIS — M5442 Lumbago with sciatica, left side: Secondary | ICD-10-CM

## 2023-09-01 DIAGNOSIS — R202 Paresthesia of skin: Secondary | ICD-10-CM | POA: Diagnosis not present

## 2023-09-01 DIAGNOSIS — M5441 Lumbago with sciatica, right side: Secondary | ICD-10-CM | POA: Diagnosis not present

## 2023-09-01 MED ORDER — CYCLOBENZAPRINE HCL 10 MG PO TABS: 10.0000 mg | ORAL_TABLET | Freq: Every evening | ORAL | 0 refills | Status: DC | PRN

## 2023-09-01 MED ORDER — MELOXICAM 15 MG PO TABS: 15.0000 mg | ORAL_TABLET | Freq: Every day | ORAL | 0 refills | Status: DC

## 2023-09-01 NOTE — Progress Notes (Signed)
[name] - 27 y.o. female MRN [id number]  Date of birth: 12-Dec-1995  Office Visit Note: Visit Date: 09/01/2023 PCP: Reva Bores, MD Referred by: Hurshel Party  Subjective: Chief Complaint  Patient presents with   Lower Back - Pain   Right Leg - Pain, Numbness, Tingling   Left Leg - Pain, Numbness, Tingling   HPI: Jeanette [name] is a 27 y.o. female who comes in today per the request of Sharen Counter, CNM for evaluation of chronic, worsening and severe bilateral lower back pain and numbness/tingling to both legs. States pain can intermittently radiate up her back. She reports paresthesias started after birth of her first child about 5 years ago but did resolve over time. Lower back pain and paresthesias returned after birth of her second child 4 years ago. She reports complications with epidural during birth of her second child. Her pain worsens with standing and activity. Sitting seems to help alleviate her pain. She describes pain as burning and sore sensation, currently rates as 7 out of 10. Some relief of pain with home exercise regimen, rest and use of medications. No history of formal physical therapy. No history of lumbar surgery/injections. She currently works full time in [employer], states she does sit for long periods of time. Patient denies focal weakness, numbness and tingling. No recent trauma or injuries.   Review of Systems  Musculoskeletal:  Positive for back pain.  Neurological:  Positive for tingling. Negative for focal weakness and weakness.  All other systems reviewed and are negative.  Otherwise per HPI.  Assessment & Plan: Visit Diagnoses:    ICD-10-CM   1. Lumbar radiculopathy  M54.16 XR Lumbar Spine 2-3 Views    Ambulatory referral to Physical Therapy    MR LUMBAR SPINE WO CONTRAST    2. Chronic bilateral low back pain with bilateral sciatica  M54.42 Ambulatory referral to Physical Therapy   M54.41 MR LUMBAR SPINE WO CONTRAST    G89.29     3. Paresthesia of skin  R20.2 Ambulatory referral to Physical Therapy    MR LUMBAR SPINE WO CONTRAST    4. Myofascial pain syndrome  M79.18 Ambulatory referral to Physical Therapy    MR LUMBAR SPINE WO CONTRAST       Plan: Findings:  Chronic, worsening and severe bilateral lower back pain and numbness/tingling to both legs, intermittent pain radiating up her back. Patient continues to have severe pain despite good conservative therapies such as home exercise regimen, rest and use of medications. I obtained lumbar x-rays in the office today that show mild dextrocurvature,  Patients clinical presentation and exam are complex, differentials include lumbar radiculopathy, her pain distribution does not follow specific dermatomal pattern. I also feel there is a myofascial component contributing to her symptoms. Tenderness noted to bilateral lumbar paraspinal regions. We discussed treatment plan in detail today, next step is place order for short course of formal physical therapy. I also placed order for lumbar MRI imaging due to chronicity of her symptoms. We discussed medication management and I prescribed Meloxicam and Flexeril for her to try. I will see her back for lumbar MRI review and to discuss options. No red flag symptoms noted upon exam today.     Meds & Orders:  Meds ordered this encounter  Medications   meloxicam (MOBIC) 15 MG tablet    Sig: Take 1 tablet (15 mg total) by mouth daily.    Dispense:  30 tablet    Refill:  0  cyclobenzaprine (FLEXERIL) 10 MG tablet    Sig: Take 1 tablet (10 mg total) by mouth at bedtime as needed for muscle spasms.    Dispense:  30 tablet    Refill:  0    Orders Placed This Encounter  Procedures   XR Lumbar Spine 2-3 Views   MR LUMBAR SPINE WO CONTRAST   Ambulatory referral to Physical Therapy    Follow-up: Return for lumbar MRI review.   Procedures: No procedures performed      Clinical History: No specialty comments  available.   She reports that she quit smoking about 6 years ago. Her smoking use included cigarettes. She has never used smokeless tobacco. No results for input(s): "HGBA1C", "LABURIC" in the last 8760 hours.  Objective:  VS:  HT:    WT:   BMI:     BP:125/79  HR:76bpm  TEMP: ( )  RESP:  Physical Exam Vitals and nursing note reviewed.  HENT:     Head: Normocephalic and atraumatic.     Right Ear: External ear normal.     Left Ear: External ear normal.     Nose: Nose normal.     Mouth/Throat:     Mouth: Mucous membranes are moist.  Eyes:     Extraocular Movements: Extraocular movements intact.  Cardiovascular:     Rate and Rhythm: Normal rate.     Pulses: Normal pulses.  Pulmonary:     Effort: Pulmonary effort is normal.  Abdominal:     General: Abdomen is flat. There is no distension.  Musculoskeletal:        General: Tenderness present.     Cervical back: Normal range of motion.     Comments: Patient rises from seated position to standing without difficulty. Good lumbar range of motion. No pain noted with facet loading. 5/5 strength noted with bilateral hip flexion, knee flexion/extension, ankle dorsiflexion/plantarflexion and EHL. No clonus noted bilaterally. No pain upon palpation of greater trochanters. No pain with internal/external rotation of bilateral hips. Sensation intact bilaterally. Tenderness noted to bilateral lumbar paraspinal regions. Negative slump test bilaterally. Ambulates without aid, gait steady.       Skin:    General: Skin is warm and dry.     Capillary Refill: Capillary refill takes less than 2 seconds.  Neurological:     General: No focal deficit present.     Mental Status: She is alert and oriented to person, place, and time.  Psychiatric:        Mood and Affect: Mood normal.        Behavior: Behavior normal.     Ortho Exam  Imaging: XR Lumbar Spine 2-3 Views  Result Date: 09/01/2023 AP and lateral radiographs of lumbar spine exhibits  increased lordosis, mild levocurvature, well preserved disc spacing, no sclerosis noted to sacroiliac joints, no fractures or dislocations.    Past Medical/Family/Surgical/Social History: Medications & Allergies reviewed per EMR, new medications updated. Patient Active Problem List   Diagnosis Date Noted   Low TSH level 08/28/2018   History of shoulder dystocia in prior pregnancy 08/08/2018   BMI 45.0-49.9, adult (HCC) 08/08/2018   History of gestational hypertension 03/10/2018   Past Medical History:  Diagnosis Date   Medical history non-contributory    Obesity    Family History  Problem Relation Age of Onset   Healthy Mother    Healthy Father    Past Surgical History:  Procedure Laterality Date   NO PAST SURGERIES     Social History  Occupational History   Not on file  Tobacco Use   Smoking status: Former    Current packs/day: 0.00    Types: Cigarettes    Quit date: 07/15/2017    Years since quitting: 6.1   Smokeless tobacco: Never  Vaping Use   Vaping status: Never Used  Substance and Sexual Activity   Alcohol use: Never   Drug use: Not Currently    Types: Marijuana    Comment: stopped 09/01//2019   Sexual activity: Yes    Birth control/protection: None

## 2023-09-01 NOTE — Progress Notes (Signed)
Functional Pain Scale - descriptive words and definitions  Distressing (6)    Pain is present/unable to complete most ADLs limited by pain/sleep is difficult and active distraction is only marginal. Moderate range order  Average Pain 8  Bilateral chronic back pain, numbness and tingling down both legs.

## 2023-09-05 ENCOUNTER — Encounter: Payer: Self-pay | Admitting: Physical Medicine and Rehabilitation

## 2023-09-12 ENCOUNTER — Inpatient Hospital Stay: Admission: RE | Admit: 2023-09-12 | Payer: Medicaid Other | Source: Ambulatory Visit

## 2023-09-18 ENCOUNTER — Ambulatory Visit: Payer: Medicaid Other | Admitting: Advanced Practice Midwife

## 2023-09-18 ENCOUNTER — Encounter: Payer: Self-pay | Admitting: Advanced Practice Midwife

## 2023-09-18 VITALS — BP 123/79 | HR 71 | Ht 63.0 in | Wt 213.0 lb

## 2023-09-18 DIAGNOSIS — R87610 Atypical squamous cells of undetermined significance on cytologic smear of cervix (ASC-US): Secondary | ICD-10-CM | POA: Insufficient documentation

## 2023-09-18 DIAGNOSIS — Z23 Encounter for immunization: Secondary | ICD-10-CM | POA: Diagnosis not present

## 2023-09-18 DIAGNOSIS — R87810 Cervical high risk human papillomavirus (HPV) DNA test positive: Secondary | ICD-10-CM

## 2023-09-18 DIAGNOSIS — Z3202 Encounter for pregnancy test, result negative: Secondary | ICD-10-CM | POA: Diagnosis not present

## 2023-09-18 DIAGNOSIS — Z30430 Encounter for insertion of intrauterine contraceptive device: Secondary | ICD-10-CM

## 2023-09-18 DIAGNOSIS — Z975 Presence of (intrauterine) contraceptive device: Secondary | ICD-10-CM | POA: Insufficient documentation

## 2023-09-18 LAB — POCT URINE PREGNANCY: Preg Test, Ur: NEGATIVE

## 2023-09-18 MED ORDER — LEVONORGESTREL 20.1 MCG/DAY IU IUD
1.0000 | INTRAUTERINE_SYSTEM | Freq: Once | INTRAUTERINE | Status: AC
Start: 2023-09-18 — End: 2023-09-18
  Administered 2023-09-18: 1 via INTRAUTERINE

## 2023-09-18 NOTE — Progress Notes (Signed)
Pt presents for [facility] f/u. Pt interested in Payette IUD. Last unprotected sex 1 month ago. Negative UPT

## 2023-09-18 NOTE — Progress Notes (Signed)
   GYNECOLOGY PROGRESS NOTE  History:  27 y.o. [id number] presents to [facility] office today for contraceptive visit.  She had Liletta IUD and was concerned it was causing back pain and other symptoms so it was removed 08/21/23.  She is following up with orthopedics and physical therapy and does not think the IUD was the problem now.  She desires replacement of the Liletta.  She also had ASCUS on her pap 9/30 and has questions about that.  She started the Gardasil vaccine series at age 49 but only received 2 of the 3 injections.    Health Maintenance Due  Topic Date Due   INFLUENZA VACCINE  06/22/2023   COVID-19 Vaccine (1 - 2023-24 season) Never done   HPV VACCINES (2 - 3-dose series) 10/16/2023     Review of Systems:  Pertinent items are noted in HPI.   Objective:  Physical Exam Blood pressure 123/79, pulse 71, height 5\' 3"  (1.6 m), weight 213 lb (96.6 kg). VS reviewed, nursing note reviewed,  Constitutional: well developed, well nourished, no distress HEENT: normocephalic CV: normal rate Pulm/chest wall: normal effort Breast Exam: deferred Abdomen: soft Neuro: alert and oriented x 3 Skin: warm, dry Psych: affect normal Pelvic exam: Cervix pink, visually closed, without lesion, scant white creamy discharge, vaginal walls and external genitalia normal Bimanual exam: Cervix 0/long/high, firm, anterior, neg CMT, uterus nontender, nonenlarged, adnexa without tenderness, enlargement, or mass   IUD Procedure Note Patient identified, informed consent performed.  Discussed risks of irregular bleeding, cramping, infection, malpositioning or misplacement of the IUD outside the uterus which may require further procedures. Time out was performed.  Urine pregnancy test negative.  Speculum placed in the vagina.  Cervix visualized.  Cleaned with Betadine x 2.  Grasped anteriorly with a single tooth tenaculum.  Uterus sounded to 6.5 cm.  Liletta IUD placed per manufacturer's recommendations.   Strings trimmed to 3 cm. Tenaculum was removed, good hemostasis noted.  Patient tolerated procedure well.   Patient was given post-procedure instructions and the Liletta care card with expiration date.  Patient was also asked to check IUD strings periodically and follow up for IUD check.   Assessment & Plan:  1. ASCUS with positive high risk HPV cervical --Colpo recommended following Pap result from 08/21/23 --Reviewed results with pt today --Plan Colpo with next visit  2. Encounter for IUD insertion --Liletta inserted without difficulty. Pt did have bleeding following insertion, silver nitrate was applied to cervix a 10 and 2 o'clock, where tenaculum was applied and bleeding slowed.  Pt menses expected so some bleeding likely menstrual.   --Bleeding precautions/reasons to seek care reviewed  - POCT urine pregnancy--negative  3. Need for HPV vaccine  --Discussed Gardasil vaccine with pt, pt had part of series 10 years ago as a teenager but didn't complete it. Desires vaccination today.   --Gardasil vaccine dose # 1 given today   Return in about 8 weeks (around 11/13/2023) for colpo, IUD string check, Gardasil #2.   Sharen Counter, CNM 5:14 PM

## 2023-09-20 ENCOUNTER — Ambulatory Visit: Payer: Medicaid Other | Attending: Physical Medicine and Rehabilitation | Admitting: Physical Therapy

## 2023-11-13 ENCOUNTER — Encounter: Payer: Medicaid Other | Admitting: Obstetrics and Gynecology

## 2024-01-04 ENCOUNTER — Encounter (HOSPITAL_COMMUNITY): Payer: Self-pay | Admitting: *Deleted

## 2024-01-04 ENCOUNTER — Other Ambulatory Visit: Payer: Self-pay

## 2024-01-04 ENCOUNTER — Emergency Department (HOSPITAL_COMMUNITY)
Admission: EM | Admit: 2024-01-04 | Discharge: 2024-01-04 | Disposition: A | Discharge status: Home / Self Care | Payer: Medicaid Other | Attending: Emergency Medicine | Admitting: Emergency Medicine

## 2024-01-04 DIAGNOSIS — N760 Acute vaginitis: Secondary | ICD-10-CM | POA: Insufficient documentation

## 2024-01-04 DIAGNOSIS — B9689 Other specified bacterial agents as the cause of diseases classified elsewhere: Secondary | ICD-10-CM | POA: Insufficient documentation

## 2024-01-04 DIAGNOSIS — N898 Other specified noninflammatory disorders of vagina: Secondary | ICD-10-CM

## 2024-01-04 DIAGNOSIS — R102 Pelvic and perineal pain: Secondary | ICD-10-CM | POA: Diagnosis not present

## 2024-01-04 DIAGNOSIS — R350 Frequency of micturition: Secondary | ICD-10-CM | POA: Diagnosis not present

## 2024-01-04 DIAGNOSIS — R300 Dysuria: Secondary | ICD-10-CM | POA: Diagnosis present

## 2024-01-04 LAB — WET PREP, GENITAL
Sperm: NONE SEEN
Trich, Wet Prep: NONE SEEN
WBC, Wet Prep HPF POC: <10 (ref <10)
Yeast Wet Prep HPF POC: NONE SEEN

## 2024-01-04 LAB — URINALYSIS, ROUTINE W REFLEX MICROSCOPIC
Bacteria, UA: NONE SEEN
Bilirubin Urine: NEGATIVE
Glucose, UA: NEGATIVE mg/dL
Ketones, ur: NEGATIVE mg/dL
Nitrite: NEGATIVE
Protein, ur: 30 mg/dL — AB
Specific Gravity, Urine: 1.018 (ref 1.005–1.030)
WBC, UA: >50 WBC/hpf (ref 0–5)
pH: 7 (ref 5.0–8.0)

## 2024-01-04 LAB — PREGNANCY, URINE: Preg Test, Ur: NEGATIVE

## 2024-01-04 MED ORDER — METRONIDAZOLE 500 MG PO TABS: 500.0000 mg | ORAL_TABLET | Freq: Two times a day (BID) | ORAL | 0 refills | Status: DC

## 2024-01-04 MED ORDER — CEPHALEXIN 500 MG PO CAPS: 500.0000 mg | ORAL_CAPSULE | Freq: Two times a day (BID) | ORAL | 0 refills | Status: AC

## 2024-01-04 NOTE — Discharge Instructions (Signed)
You were seen in the emergency today for pelvic pressure.  The lesion on the outside of your vaginal canal, does not appear concerning at this time.  We did test you for bacterial vaginosis, yeast, and trichomonas.  These results should be in a few hours and I can call you with the results and call in any medications you need.  The other test was for gonorrhea and chlamydia.  That normally takes a few days to result.  If either test is positive, I recommend going to urgent care, your primary care, or the ER for necessary treatment.  Continue to monitor how you're doing and return to the ER for new or worsening symptoms.

## 2024-01-04 NOTE — ED Triage Notes (Signed)
Here by POV from home for pelvic pressure during and after voiding with associated frequency, urgency, nausea and vaginal abscess/ cyst with drainage. Onset 2d ago. Denies fever, chills, vomiting, diarrhea, itching, lesions, bleeding, hematuria, back or abd pain.

## 2024-01-04 NOTE — ED Provider Notes (Signed)
[place] EMERGENCY DEPARTMENT AT [facility] Provider Note   CSN: [id number] Arrival date & time: 01/04/24  8299     History  Chief Complaint  Patient presents with   Dysuria    Jeanette [name] is a 28 y.o. female who presents to the emergency department complaining of pelvic pressure with voiding, increased urinary frequency, urgency, and nausea.  She also notes that she has a vaginal abscess/cyst with drainage that started about 2 days ago.  Denies any fever, chills, vomiting, diarrhea, vaginal itching, vaginal bleeding, hematuria, back or abdominal pain.  States that she previously tested positive for herpes and is unsure if the area that she is feeling could be an outbreak or not.  She says the swelling and it has significantly gone down since yesterday, and it is no longer painful.   Dysuria Associated symptoms: vaginal discharge        Home Medications Prior to Admission medications   Medication Sig Start Date End Date Taking? Authorizing Provider  cephALEXin (KEFLEX) 500 MG capsule Take 1 capsule (500 mg total) by mouth 2 (two) times daily for 7 days. 01/04/24 01/11/24 Yes Ramatoulaye Pack T, PA-C  metroNIDAZOLE (FLAGYL) 500 MG tablet Take 1 tablet (500 mg total) by mouth 2 (two) times daily. 01/04/24  Yes Milia Warth T, PA-C      Allergies    Patient has no known allergies.    Review of Systems   Review of Systems  Genitourinary:  Positive for dysuria, frequency, urgency and vaginal discharge.  All other systems reviewed and are negative.   Physical Exam Updated Vital Signs BP 139/82 (BP Location: Left Arm)   Pulse 91   Temp 98.4 F (36.9 C) (Oral)   Resp 18   Wt 96.6 kg   LMP 11/29/2023 (Exact Date)   SpO2 100%   BMI 37.73 kg/m  Physical Exam Vitals and nursing note reviewed.  Constitutional:      Appearance: Normal appearance.  HENT:     Head: Normocephalic and atraumatic.  Eyes:     Conjunctiva/sclera: Conjunctivae normal.   Pulmonary:     Effort: Pulmonary effort is normal. No respiratory distress.  Genitourinary:    Labia:        Right: Lesion present.      Comments: Small lesion on the right labia, nontender, no fluctuance Patient elected to self swab rather than perform internal examination Skin:    General: Skin is warm and dry.  Neurological:     Mental Status: She is alert.  Psychiatric:        Mood and Affect: Mood normal.        Behavior: Behavior normal.     ED Results / Procedures / Treatments   Labs (all labs ordered are listed, but only abnormal results are displayed) Labs Reviewed  WET PREP, GENITAL - Abnormal; Notable for the following components:      Result Value   Clue Cells Wet Prep HPF POC PRESENT (*)    All other components within normal limits  URINALYSIS, ROUTINE W REFLEX MICROSCOPIC - Abnormal; Notable for the following components:   APPearance CLOUDY (*)    Hgb urine dipstick SMALL (*)    Protein, ur 30 (*)    Leukocytes,Ua LARGE (*)    All other components within normal limits  PREGNANCY, URINE  GC/CHLAMYDIA PROBE AMP () NOT AT [facility]    EKG None  Radiology No results found.  Procedures Procedures    Medications Ordered in ED  Medications - No data to display  ED Course/ Medical Decision Making/ A&P                                 Medical Decision Making Amount and/or Complexity of Data Reviewed Labs: ordered.  Risk Prescription drug management.   This patient is a 28 y.o. female  who presents to the ED for concern of vaginal discharge and pelvic pressure..   Differential diagnoses prior to evaluation: The emergent differential diagnosis includes, but is not limited to,  Bacterial vaginosis, yeast vaginitis, STD, PID, retained foreign body. This is not an exhaustive differential.   Past Medical History / Co-morbidities / Social History: Reports history of herpes, no other significant past medical history  Physical Exam: Physical exam  performed. The pertinent findings include: Small external labial lesion, nontender, not consistent with herpes outbreak.  Patient elected to self swab discharge rather than perform internal pelvic examination  Lab Tests/Imaging studies: I personally interpreted labs/imaging and the pertinent results include:  Urine negative, UA with small hemoglobin, large leukocytes, over 50 WBCs, but no bacteria seen.  Wet prep and G/C testing pending at time of discharge.  Disposition: After consideration of the diagnostic results and the patients response to treatment, I feel that emergency department workup does not suggest an emergent condition requiring admission or immediate intervention beyond what has been performed at this time. The plan is: Discharge to home.  Advised I will call the patient later with her results of her wet prep.  She plans to follow-up on her G/C testing and obtain treatment if needed.. The patient is safe for discharge and has been instructed to return immediately for worsening symptoms, change in symptoms or any other concerns.  1420 -- Wet prep resulted with clue cells consistent with bacterial vaginosis.  Will send an antibiotic for that as well as UTI to patient's pharmacy on file.  Called and updated her with results.  Final Clinical Impression(s) / ED Diagnoses Final diagnoses:  Vaginal discharge  Pelvic pressure in female  Urinary frequency  Bacterial vaginosis    Rx / DC Orders ED Discharge Orders          Ordered    metroNIDAZOLE (FLAGYL) 500 MG tablet  2 times daily        01/04/24 1442    cephALEXin (KEFLEX) 500 MG capsule  2 times daily        01/04/24 1442           Portions of this report may have been transcribed using voice recognition software. Every effort was made to ensure accuracy; however, inadvertent computerized transcription errors may be present.    Su Monks, PA-C 01/04/24 1447    Melene Plan, DO 01/04/24 1505

## 2024-01-05 LAB — GC/CHLAMYDIA PROBE AMP (~~LOC~~) NOT AT ARMC
Chlamydia: NEGATIVE
Comment: NEGATIVE
Comment: NORMAL
Neisseria Gonorrhea: NEGATIVE

## 2024-03-03 ENCOUNTER — Other Ambulatory Visit: Payer: Self-pay

## 2024-03-03 ENCOUNTER — Emergency Department (HOSPITAL_COMMUNITY)
Admission: EM | Admit: 2024-03-03 | Discharge: 2024-03-04 | Disposition: A | Discharge status: Home / Self Care | Payer: Self-pay | Attending: Emergency Medicine | Admitting: Emergency Medicine

## 2024-03-03 ENCOUNTER — Encounter (HOSPITAL_COMMUNITY): Payer: Self-pay

## 2024-03-03 DIAGNOSIS — R7401 Elevation of levels of liver transaminase levels: Secondary | ICD-10-CM | POA: Insufficient documentation

## 2024-03-03 DIAGNOSIS — N3000 Acute cystitis without hematuria: Secondary | ICD-10-CM | POA: Insufficient documentation

## 2024-03-03 DIAGNOSIS — R110 Nausea: Secondary | ICD-10-CM

## 2024-03-03 DIAGNOSIS — I10 Essential (primary) hypertension: Secondary | ICD-10-CM | POA: Insufficient documentation

## 2024-03-03 LAB — COMPREHENSIVE METABOLIC PANEL WITH GFR
ALT: 128 U/L — ABNORMAL HIGH (ref 0–44)
AST: 100 U/L — ABNORMAL HIGH (ref 15–41)
Albumin: 3.7 g/dL (ref 3.5–5.0)
Alkaline Phosphatase: 74 U/L (ref 38–126)
Anion gap: 11 (ref 5–15)
BUN: 10 mg/dL (ref 6–20)
CO2: 22 mmol/L (ref 22–32)
Calcium: 8.9 mg/dL (ref 8.9–10.3)
Chloride: 103 mmol/L (ref 98–111)
Creatinine, Ser: 0.93 mg/dL (ref 0.44–1.00)
GFR, Estimated: >60 mL/min (ref >60)
Glucose, Bld: 101 mg/dL — ABNORMAL HIGH (ref 70–99)
Potassium: 3.6 mmol/L (ref 3.5–5.1)
Sodium: 136 mmol/L (ref 135–145)
Total Bilirubin: 1.1 mg/dL (ref 0.0–1.2)
Total Protein: 8.4 g/dL — ABNORMAL HIGH (ref 6.5–8.1)

## 2024-03-03 LAB — URINALYSIS, ROUTINE W REFLEX MICROSCOPIC
Bilirubin Urine: NEGATIVE
Glucose, UA: NEGATIVE mg/dL
Ketones, ur: 5 mg/dL — AB
Nitrite: NEGATIVE
Protein, ur: 100 mg/dL — AB
Specific Gravity, Urine: 1.021 (ref 1.005–1.030)
pH: 5 (ref 5.0–8.0)

## 2024-03-03 LAB — CBC
HCT: 44.7 % (ref 36.0–46.0)
Hemoglobin: 14.8 g/dL (ref 12.0–15.0)
MCH: 30.3 pg (ref 26.0–34.0)
MCHC: 33.1 g/dL (ref 30.0–36.0)
MCV: 91.6 fL (ref 80.0–100.0)
Platelets: 214 10*3/uL (ref 150–400)
RBC: 4.88 MIL/uL (ref 3.87–5.11)
RDW: 13.4 % (ref 11.5–15.5)
WBC: 8.8 10*3/uL (ref 4.0–10.5)
nRBC: 0 % (ref 0.0–0.2)

## 2024-03-03 LAB — HCG, SERUM, QUALITATIVE: Preg, Serum: NEGATIVE

## 2024-03-03 LAB — LIPASE, BLOOD: Lipase: 26 U/L (ref 11–51)

## 2024-03-03 NOTE — ED Triage Notes (Signed)
 Pt complaining of nausea and vomiting for the last 2-3 days. Also has been having a migraine headache today. Said she can not keep any food down today.

## 2024-03-03 NOTE — ED Notes (Signed)
 Pt reports throwing up blood once today.

## 2024-03-04 ENCOUNTER — Emergency Department (HOSPITAL_COMMUNITY): Payer: Self-pay

## 2024-03-04 LAB — HEPATITIS PANEL, ACUTE
HCV Ab: NONREACTIVE
Hep A IgM: NONREACTIVE
Hep B C IgM: NONREACTIVE
Hepatitis B Surface Ag: NONREACTIVE

## 2024-03-04 MED ORDER — CEPHALEXIN 500 MG PO CAPS: 500.0000 mg | ORAL_CAPSULE | Freq: Two times a day (BID) | ORAL | 0 refills | Status: AC

## 2024-03-04 NOTE — ED Notes (Signed)
 Pt took off vital signs equipment and not wanting discharge vitals. Pt in no distress, GCS 15.

## 2024-03-04 NOTE — ED Provider Notes (Addendum)
 [place] EMERGENCY DEPARTMENT AT [facility] Provider Note   CSN: [id number] Arrival date & time: 03/03/24  1857     History  Chief Complaint  Patient presents with   Nausea   Jeanette [name] is a 28 y.o. female who presents with concern for 2 to 3 days of nausea, NBNB emesis, headaches and upper abdominal pain.  No urinary symptoms, no diarrhea.  No fevers or chills, no known ill contacts.  Accompanied by her mother at bedside.  Recently completed course of agile for BV. Patient works from home but has children who are in school.  No known ill contacts. HPI     Home Medications Prior to Admission medications   Medication Sig Start Date End Date Taking? Authorizing Provider  cephALEXin (KEFLEX) 500 MG capsule Take 1 capsule (500 mg total) by mouth 2 (two) times daily for 5 days. 03/04/24 03/09/24 Yes Harpreet Pompey, Lupe Carney R, PA-C  metroNIDAZOLE (FLAGYL) 500 MG tablet Take 1 tablet (500 mg total) by mouth 2 (two) times daily. 01/04/24   Roemhildt, Lorin T, PA-C      Allergies    Patient has no known allergies.    Review of Systems   Review of Systems  Constitutional: Negative.   HENT: Negative.    Respiratory: Negative.    Gastrointestinal:  Positive for abdominal pain, nausea and vomiting.  Genitourinary: Negative.   Musculoskeletal: Negative.   Skin: Negative.   Neurological: Negative.     Physical Exam Updated Vital Signs BP (!) 129/105   Pulse 84   Temp 99.6 F (37.6 C) (Oral)   Resp (!) 24   Ht 5\' 3"  (1.6 m)   Wt 94.8 kg   LMP 02/02/2024   SpO2 99%   BMI 37.02 kg/m  Physical Exam Vitals and nursing note reviewed.  Constitutional:      Appearance: She is morbidly obese. She is not ill-appearing or toxic-appearing.  HENT:     Head: Normocephalic and atraumatic.     Mouth/Throat:     Mouth: Mucous membranes are moist.     Pharynx: No oropharyngeal exudate or posterior oropharyngeal erythema.  Eyes:     General:        Right eye: No discharge.         Left eye: No discharge.     Extraocular Movements: Extraocular movements intact.     Conjunctiva/sclera: Conjunctivae normal.     Pupils: Pupils are equal, round, and reactive to light.  Cardiovascular:     Rate and Rhythm: Normal rate and regular rhythm.     Pulses: Normal pulses.     Heart sounds: Normal heart sounds. No murmur heard. Pulmonary:     Effort: Pulmonary effort is normal. No respiratory distress.     Breath sounds: Normal breath sounds. No wheezing or rales.  Abdominal:     General: Bowel sounds are normal. There is no distension.     Palpations: Abdomen is soft.     Tenderness: There is abdominal tenderness in the right upper quadrant and epigastric area. There is no right CVA tenderness, left CVA tenderness, guarding or rebound.  Musculoskeletal:     Cervical back: Neck supple.     Right lower leg: No edema.     Left lower leg: No edema.  Skin:    General: Skin is warm and dry.     Capillary Refill: Capillary refill takes less than 2 seconds.  Neurological:     General: No focal deficit present.  Mental Status: She is alert and oriented to person, place, and time. Mental status is at baseline.  Psychiatric:        Mood and Affect: Mood normal.     ED Results / Procedures / Treatments   Labs (all labs ordered are listed, but only abnormal results are displayed) Labs Reviewed  COMPREHENSIVE METABOLIC PANEL WITH GFR - Abnormal; Notable for the following components:      Result Value   Glucose, Bld 101 (*)    Total Protein 8.4 (*)    AST 100 (*)    ALT 128 (*)    All other components within normal limits  URINALYSIS, ROUTINE W REFLEX MICROSCOPIC - Abnormal; Notable for the following components:   Color, Urine AMBER (*)    APPearance CLOUDY (*)    Hgb urine dipstick SMALL (*)    Ketones, ur 5 (*)    Protein, ur 100 (*)    Leukocytes,Ua SMALL (*)    Bacteria, UA RARE (*)    All other components within normal limits  LIPASE, BLOOD  CBC  HCG,  SERUM, QUALITATIVE  HEPATITIS PANEL, ACUTE    EKG None  Radiology US Abdomen Limited RUQ (LIVER/GB) Result Date: 03/04/2024 CLINICAL DATA:  Right upper quadrant pain EXAM: ULTRASOUND ABDOMEN LIMITED RIGHT UPPER QUADRANT COMPARISON:  None Available. FINDINGS: Gallbladder: No gallstones or wall thickening visualized. No sonographic Murphy sign noted by sonographer. Common bile duct: Diameter: 3.1 mm. Liver: No focal lesion identified. Within normal limits in parenchymal echogenicity. Portal vein is patent on color Doppler imaging with normal direction of blood flow towards the liver. Other: None. IMPRESSION: Unremarkable right upper quadrant ultrasound. Electronically Signed   By: Alcide Clever M.D.   On: 03/04/2024 02:12    Procedures Procedures    Medications Ordered in ED Medications - No data to display  ED Course/ Medical Decision Making/ A&P                                 Medical Decision Making 28 y/o female who presents with concern for N/V.   HTN on intake, VS otherwise normal. Abdominal exam is with RUQ TTP without murphy's sign. No CVAT  The differential diagnosis for RUQ includes but is not limited to:  Cholelithiasis / choledocholithiasis / cholecystitis / cholangitis, hepatitis (eg. viral, alcoholic, toxic),liver abscess, pancreatitis, liver / pancreatic / biliary tract cancer, ischemic hepatopathy (shock liver), hepatic vein obstruction (Budd-Chiari syndrome), liver cell adenoma, peptic ulcer disease (duodenal), functional or nonulcer dyspepsia, right lower lobe pneumonia, pyelonephritis, urinary calculi,  Fitz-Hugh-Curtis syndrome (with pelvic inflammatory disease), herpes zoster, trauma or musculoskeletal pain, herniated disk, abdominal abscess, intestinal ischemia, physical or sexual abuse, ectopic pregnancy, IUP, Mittelschmerz, ovarian cyst/torsion, threatened/ievitable abortion, PID, endometriosis, molar pregnancy, heterotopic pregnancy, corpus luteum cyst, appendicitis,  UTI/renal colic, IBD.    Amount and/or Complexity of Data Reviewed Labs: ordered.    Details: CBC unremarkable, CMP with transaminitis with AST/ALT 100/128.  Lipase is normal.  UA suggestive of infection.  Patient is not pregnant. Hepatitis panel.  Radiology: ordered.    Details: RUQ US unremarkable  Risk Prescription drug management.   Clinical picture most consistent with viral etiology of the patient's nausea, vomiting, and headache.  Urinary tract infection on UA, antibiotic prescribed in the outpatient setting.  Antiemetic offered for patient to have at home, however patient declined.  Recommend close outpatient gastroenterology follow-up.  Clinical concern for emergent underlying condition that  would warrant further ED workup or patient management is exceedingly low.  Richie Chapel voiced understanding of her medical evaluation and treatment plan. Each of their questions answered to their expressed satisfaction.  Return precautions were given.  Patient is well-appearing, stable, and was discharged in good condition.  This chart was dictated using voice recognition software, Dragon. Despite the best efforts of this provider to proofread and correct errors, errors may still occur which can change documentation meaning.   Final Clinical Impression(s) / ED Diagnoses Final diagnoses:  Acute cystitis without hematuria  Nausea    Rx / DC Orders ED Discharge Orders          Ordered    cephALEXin (KEFLEX) 500 MG capsule  2 times daily        03/04/24 0233             Unika Nazareno, Adelle Agent, PA-C 03/04/24 0240    Palumbo, April, MD 03/04/24 0302

## 2024-03-04 NOTE — Discharge Instructions (Signed)
 Please follow up with the gastroenterologist listed below. Return to the ER with any new severe symptoms.

## 2024-09-02 ENCOUNTER — Other Ambulatory Visit: Payer: Self-pay

## 2024-09-02 ENCOUNTER — Encounter (HOSPITAL_COMMUNITY): Payer: Self-pay

## 2024-09-02 ENCOUNTER — Emergency Department (HOSPITAL_COMMUNITY)
Admission: EM | Admit: 2024-09-02 | Discharge: 2024-09-02 | Disposition: A | Discharge status: Home / Self Care | Payer: Self-pay | Attending: Emergency Medicine | Admitting: Emergency Medicine

## 2024-09-02 DIAGNOSIS — Z23 Encounter for immunization: Secondary | ICD-10-CM | POA: Insufficient documentation

## 2024-09-02 DIAGNOSIS — Z1889 Other specified retained foreign body fragments: Secondary | ICD-10-CM | POA: Insufficient documentation

## 2024-09-02 DIAGNOSIS — G43909 Migraine, unspecified, not intractable, without status migrainosus: Secondary | ICD-10-CM | POA: Insufficient documentation

## 2024-09-02 DIAGNOSIS — L923 Foreign body granuloma of the skin and subcutaneous tissue: Secondary | ICD-10-CM | POA: Insufficient documentation

## 2024-09-02 MED ORDER — NAPROXEN 500 MG PO TABS: 500.0000 mg | ORAL_TABLET | Freq: Two times a day (BID) | ORAL | 0 refills | Status: DC

## 2024-09-02 MED ORDER — TETANUS-DIPHTH-ACELL PERTUSSIS 5-2-15.5 LF-MCG/0.5 IM SUSP
0.5000 mL | Freq: Once | INTRAMUSCULAR | Status: AC
  Administered 2024-09-02: 0.5 mL via INTRAMUSCULAR
  Filled 2024-09-02: qty 0.5

## 2024-09-02 MED ORDER — SULFAMETHOXAZOLE-TRIMETHOPRIM 800-160 MG PO TABS: 1.0000 | ORAL_TABLET | Freq: Two times a day (BID) | ORAL | 0 refills | Status: AC

## 2024-09-02 NOTE — ED Triage Notes (Signed)
 PT presents POV for concerns of infected tatoo site, migraine, and dizziness.

## 2024-09-02 NOTE — ED Provider Notes (Signed)
 [place] EMERGENCY DEPARTMENT AT [facility] Provider Note   CSN: [id number] Arrival date & time: 09/02/24  1229     Patient presents with: Infected tattoo site, ; Migraine; and Dizziness   Jeanette [name] is a 28 y.o. female patient with noncontributory past medical history reports to emergency room with complaint of tattoo infection.  Patient reports that she got a tattoo on her right lower ankle.  She reports that this was with a new tattoo gun and she is certain that the products were clean. Not concerned with blood borne disease.  She does note that she has had some erythema and swelling around the tattoo.  She has not had any fever or chills or streaking redness.  She has been trying antibacterial ointment and washing with soap and water.  She is unsure of her last tetanus shot.    Migraine  Dizziness      Prior to Admission medications   Medication Sig Start Date End Date Taking? Authorizing Provider  naproxen (NAPROSYN) 500 MG tablet Take 1 tablet (500 mg total) by mouth 2 (two) times daily. 09/02/24  Yes Khristen Cheyney N, PA-C  sulfamethoxazole -trimethoprim  (BACTRIM  DS) 800-160 MG tablet Take 1 tablet by mouth 2 (two) times daily for 7 days. 09/02/24 09/09/24 Yes Shaneequa Bahner N, PA-C  metroNIDAZOLE  (FLAGYL ) 500 MG tablet Take 1 tablet (500 mg total) by mouth 2 (two) times daily. 01/04/24   Roemhildt, Lorin T, PA-C    Allergies: Patient has no known allergies.    Review of Systems  Skin:  Positive for wound.  Neurological:  Positive for dizziness.    Updated Vital Signs BP 133/72 (BP Location: Right Arm)   Pulse 91   Temp 98.5 F (36.9 C)   Resp 17   Ht 5' 3 (1.6 m)   Wt 107.5 kg   LMP 08/26/2024 (Approximate)   SpO2 100%   BMI 41.98 kg/m   Physical Exam Vitals and nursing note reviewed.  Constitutional:      General: She is not in acute distress.    Appearance: She is not toxic-appearing.  HENT:     Head: Normocephalic and atraumatic.   Eyes:     General: No scleral icterus.    Conjunctiva/sclera: Conjunctivae normal.  Cardiovascular:     Rate and Rhythm: Normal rate and regular rhythm.     Pulses: Normal pulses.     Heart sounds: Normal heart sounds.  Pulmonary:     Effort: Pulmonary effort is normal. No respiratory distress.     Breath sounds: Normal breath sounds.  Abdominal:     General: Abdomen is flat. Bowel sounds are normal.     Palpations: Abdomen is soft.     Tenderness: There is no abdominal tenderness.  Skin:    General: Skin is warm and dry.     Findings: No lesion.     Comments: Noted to to left anterior shin with mild surrounding erythema.  No purulent drainage.  No streaking erythema.  Neurovascularly intact  Neurological:     General: No focal deficit present.     Mental Status: She is alert and oriented to person, place, and time. Mental status is at baseline.     (all labs ordered are listed, but only abnormal results are displayed) Labs Reviewed - No data to display  EKG: None  Radiology: No results found.   Procedures   Medications Ordered in the ED  Tdap (ADACEL) injection 0.5 mL (0.5 mLs Intramuscular Given 09/02/24 1330)  Medical Decision Making Risk Prescription drug management.   This patient presents to the ED for concern of infection, this involves an extensive number of treatment options, and is a complaint that carries with it a high risk of complications and morbidity.  The differential diagnosis includes sepsis, blood-borne diseases, encounter for tetanus, cellulitis, abscess    Problem List / ED Course / Critical interventions / Medication management  I ordered medication including tetanus updated here Reevaluation of the patient after these medicines showed that the patient improved I have reviewed the patients home medicines and have made adjustments as needed Patient reporting to emergency room with complaint of infected  tattoo.  She is hemodynamically stable and well-appearing.  She does not have any sign of deep space infection.  She is not getting SIRS criteria and she has not had fever.  She is certain that this was a sterile needle.  She is not concerned about blood-borne illnesses from this tattoo.  She does have some mild cellulitis and I will treat her with an antibiotic.  She has no other complaints.  Stable throughout stay feel appropriate for discharge with close outpatient follow-up.  Given return precautions.        Final diagnoses:  Tattoo reaction    ED Discharge Orders          Ordered    naproxen (NAPROSYN) 500 MG tablet  2 times daily        09/02/24 1325    sulfamethoxazole -trimethoprim  (BACTRIM  DS) 800-160 MG tablet  2 times daily        09/02/24 1325               Aamari West, Warren SAILOR, PA-C 09/02/24 1526    Yolande Lamar BROCKS, MD 09/03/24 1431

## 2024-09-02 NOTE — Discharge Instructions (Addendum)
 Please recheck symptoms with family doctor. In the meantime I would recommend Tylenol  and Naproxen for pain control.   Take antibiotic, return to ER with new or worsening symptoms.   Take antibiotic and Naproxen with food.

## 2024-10-16 ENCOUNTER — Encounter (HOSPITAL_COMMUNITY): Payer: Self-pay | Admitting: Emergency Medicine

## 2024-10-16 ENCOUNTER — Emergency Department (HOSPITAL_COMMUNITY)
Admission: EM | Admit: 2024-10-16 | Discharge: 2024-10-17 | Disposition: A | Discharge status: Home / Self Care | Payer: Self-pay | Attending: Emergency Medicine | Admitting: Emergency Medicine

## 2024-10-16 ENCOUNTER — Emergency Department (HOSPITAL_COMMUNITY): Payer: Self-pay

## 2024-10-16 ENCOUNTER — Other Ambulatory Visit: Payer: Self-pay

## 2024-10-16 DIAGNOSIS — R61 Generalized hyperhidrosis: Secondary | ICD-10-CM | POA: Insufficient documentation

## 2024-10-16 DIAGNOSIS — R059 Cough, unspecified: Secondary | ICD-10-CM | POA: Insufficient documentation

## 2024-10-16 DIAGNOSIS — R000 Tachycardia, unspecified: Secondary | ICD-10-CM | POA: Insufficient documentation

## 2024-10-16 DIAGNOSIS — R0602 Shortness of breath: Secondary | ICD-10-CM | POA: Insufficient documentation

## 2024-10-16 DIAGNOSIS — M7989 Other specified soft tissue disorders: Secondary | ICD-10-CM | POA: Insufficient documentation

## 2024-10-16 DIAGNOSIS — F17200 Nicotine dependence, unspecified, uncomplicated: Secondary | ICD-10-CM | POA: Insufficient documentation

## 2024-10-16 DIAGNOSIS — M436 Torticollis: Secondary | ICD-10-CM | POA: Insufficient documentation

## 2024-10-16 DIAGNOSIS — R079 Chest pain, unspecified: Secondary | ICD-10-CM | POA: Insufficient documentation

## 2024-10-16 LAB — CBC
HCT: 42.8 % (ref 36.0–46.0)
Hemoglobin: 13.9 g/dL (ref 12.0–15.0)
MCH: 31 pg (ref 26.0–34.0)
MCHC: 32.5 g/dL (ref 30.0–36.0)
MCV: 95.5 fL (ref 80.0–100.0)
Platelets: 258 K/uL (ref 150–400)
RBC: 4.48 MIL/uL (ref 3.87–5.11)
RDW: 12.7 % (ref 11.5–15.5)
WBC: 8.6 K/uL (ref 4.0–10.5)
nRBC: 0 % (ref 0.0–0.2)

## 2024-10-16 NOTE — ED Triage Notes (Signed)
 Pt arrive POV c/o right chest pain, radiating to her neck and SOB that started yesterday morning.

## 2024-10-17 ENCOUNTER — Emergency Department (HOSPITAL_COMMUNITY): Payer: Self-pay

## 2024-10-17 LAB — HCG, SERUM, QUALITATIVE: Preg, Serum: NEGATIVE

## 2024-10-17 LAB — BASIC METABOLIC PANEL WITH GFR
Anion gap: 11 (ref 5–15)
BUN: 15 mg/dL (ref 6–20)
CO2: 24 mmol/L (ref 22–32)
Calcium: 8.8 mg/dL — ABNORMAL LOW (ref 8.9–10.3)
Chloride: 102 mmol/L (ref 98–111)
Creatinine, Ser: 0.78 mg/dL (ref 0.44–1.00)
GFR, Estimated: >60 mL/min (ref >60)
Glucose, Bld: 85 mg/dL (ref 70–99)
Potassium: 3.5 mmol/L (ref 3.5–5.1)
Sodium: 137 mmol/L (ref 135–145)

## 2024-10-17 LAB — TROPONIN I (HIGH SENSITIVITY): Troponin I (High Sensitivity): <2 ng/L (ref <18)

## 2024-10-17 LAB — D-DIMER, QUANTITATIVE: D-Dimer, Quant: 0.63 ug{FEU}/mL — ABNORMAL HIGH (ref 0.00–0.50)

## 2024-10-17 MED ORDER — METHOCARBAMOL 500 MG PO TABS: 500.0000 mg | ORAL_TABLET | Freq: Three times a day (TID) | ORAL | 0 refills | Status: AC | PRN

## 2024-10-17 MED ORDER — METHOCARBAMOL 500 MG PO TABS
500.0000 mg | ORAL_TABLET | Freq: Once | ORAL | Status: AC
  Administered 2024-10-17: 500 mg via ORAL
  Filled 2024-10-17: qty 1

## 2024-10-17 MED ORDER — NAPROXEN 500 MG PO TABS: 500.0000 mg | ORAL_TABLET | Freq: Two times a day (BID) | ORAL | 0 refills | Status: AC

## 2024-10-17 MED ORDER — IOHEXOL 350 MG/ML SOLN
75.0000 mL | Freq: Once | INTRAVENOUS | Status: AC | PRN
  Administered 2024-10-17: 75 mL via INTRAVENOUS

## 2024-10-17 MED ORDER — KETOROLAC TROMETHAMINE 60 MG/2ML IM SOLN
15.0000 mg | Freq: Once | INTRAMUSCULAR | Status: AC
  Administered 2024-10-17: 15 mg via INTRAMUSCULAR
  Filled 2024-10-17: qty 2

## 2024-10-18 NOTE — ED Provider Notes (Signed)
 [place] EMERGENCY DEPARTMENT AT [facility] Provider Note   CSN: [id number] Arrival date & time: 10/16/24  2319     Patient presents with: Chest Pain   Jeanette [name] is a 28 y.o. female.   The patient is presenting with difficulty breathing and neck pain radiating to the shoulders and arm, described as a dead arm sensation, which began upon waking two days ago. The pain is constant and feels like pressure or tightness in the chest, exacerbated by talking or physical activity, but not by head movement. The patient reports shortness of breath, particularly during exertion, such as interacting with children or walking, and experiences a dry cough without fever, though there have been episodes of night sweats. The patient has a history of smoking occasionally and uses an IUD for birth control. There is no history of asthma, recent injuries, surgeries, or long car rides. The patient notes swelling around the ankles but denies leg pain. Aspirin  was taken for pain relief without effect. The history was obtained from the patient.     Chest Pain      Prior to Admission medications   Medication Sig Start Date End Date Taking? Authorizing Provider  methocarbamol  (ROBAXIN ) 500 MG tablet Take 1 tablet (500 mg total) by mouth every 8 (eight) hours as needed for muscle spasms. 10/17/24  Yes Meziah Blasingame, Selinda, MD  naproxen  (NAPROSYN ) 500 MG tablet Take 1 tablet (500 mg total) by mouth 2 (two) times daily. 10/17/24  Yes Kriya Westra, Selinda, MD    Allergies: Patient has no known allergies.    Review of Systems  Cardiovascular:  Positive for chest pain.    Updated Vital Signs BP 90/74   Pulse 77   Temp 98.2 F (36.8 C) (Oral)   Resp 20   Ht 5' 3 (1.6 m)   Wt 108 kg   LMP 09/22/2024 (Approximate)   SpO2 99%   BMI 42.18 kg/m   Physical Exam Vitals and nursing note reviewed.  Constitutional:      Appearance: She is well-developed.  HENT:     Head: Normocephalic and  atraumatic.  Cardiovascular:     Rate and Rhythm: Regular rhythm. Tachycardia present.  Pulmonary:     Effort: Tachypnea present. No respiratory distress.     Breath sounds: No stridor. No wheezing or rales.  Abdominal:     General: There is no distension.  Musculoskeletal:     Cervical back: Normal range of motion.  Neurological:     Mental Status: She is alert.     (all labs ordered are listed, but only abnormal results are displayed) Labs Reviewed  BASIC METABOLIC PANEL WITH GFR - Abnormal; Notable for the following components:      Result Value   Calcium  8.8 (*)    All other components within normal limits  D-DIMER, QUANTITATIVE - Abnormal; Notable for the following components:   D-Dimer, Quant 0.63 (*)    All other components within normal limits  CBC  HCG, SERUM, QUALITATIVE  TROPONIN I (HIGH SENSITIVITY)    EKG: EKG Interpretation Date/Time:  Wednesday October 16 2024 23:27:58 EST Ventricular Rate:  86 PR Interval:  154 QRS Duration:  80 QT Interval:  368 QTC Calculation: 440 R Axis:   70  Text Interpretation: Normal sinus rhythm Normal ECG When compared with ECG of 07-Jun-2015 16:04, PREVIOUS ECG IS PRESENT Confirmed by Lorette Selinda [phone]) on 10/17/2024 1:21:32 AM  Radiology: CT Angio Chest PE W and/or Wo Contrast Result Date: 10/17/2024 EXAM: CTA  CHEST 10/17/2024 04:39:55 AM TECHNIQUE: CTA of the chest was performed after the administration of 75 mL of iohexol  (OMNIPAQUE ) 350 MG/ML injection. Multiplanar reformatted images are provided for review. MIP images are provided for review. Automated exposure control, iterative reconstruction, and/or weight based adjustment of the mA/kV was utilized to reduce the radiation dose to as low as reasonably achievable. COMPARISON: PA and lateral radiographs of the chest dated 10/16/2024. CLINICAL HISTORY: Pulmonary embolism (PE) suspected, low to intermediate prob, positive D-dimer. FINDINGS: PULMONARY ARTERIES: Pulmonary  arteries are adequately opacified for evaluation. No acute pulmonary embolus. Main pulmonary artery is normal in caliber. MEDIASTINUM: The heart and pericardium demonstrate no acute abnormality. There is no acute abnormality of the thoracic aorta. LYMPH NODES: No mediastinal, hilar or axillary lymphadenopathy. LUNGS AND PLEURA: The lungs are without acute process. No focal consolidation or pulmonary edema. No evidence of pleural effusion or pneumothorax. UPPER ABDOMEN: Limited images of the upper abdomen are unremarkable. SOFT TISSUES AND BONES: No acute bone or soft tissue abnormality. IMPRESSION: 1. No evidence of pulmonary embolism. Electronically signed by: Evalene Coho MD 10/17/2024 05:11 AM EST RP Workstation: [id number]   DG Chest 2 View Result Date: 10/16/2024 EXAM: 2 VIEW(S) XRAY OF THE CHEST 10/16/2024 11:34:00 PM COMPARISON: None available. CLINICAL HISTORY: cp FINDINGS: LUNGS AND PLEURA: Vascular crowding in the right lung base. No pleural effusion. No pneumothorax. HEART AND MEDIASTINUM: No acute abnormality of the cardiac and mediastinal silhouettes. BONES AND SOFT TISSUES: No acute osseous abnormality. IMPRESSION: 1. No acute cardiopulmonary process identified to explain chest pain. Electronically signed by: Elsie Gravely MD 10/16/2024 11:36 PM EST RP Workstation: [id number]     Procedures   Medications Ordered in the ED  ketorolac  (TORADOL ) injection 15 mg (15 mg Intramuscular Given 10/17/24 0216)  methocarbamol  (ROBAXIN ) tablet 500 mg (500 mg Oral Given 10/17/24 0216)  iohexol  (OMNIPAQUE ) 350 MG/ML injection 75 mL (75 mLs Intravenous Contrast Given 10/17/24 0440)                                    Medical Decision Making Amount and/or Complexity of Data Reviewed Labs: ordered. Radiology: ordered.  Risk Prescription drug management.   The patient presents with difficulty breathing, neck pain radiating to the shoulders and arm, and a sensation of pressure in the  chest, which has been ongoing for three days. The patient reports intermittent shortness of breath, especially during physical activity or emotional stress, and has a history of smoking, though not daily. There is also noted swelling around the ankles, but no pain or recent injuries. The patient denies fever but reports night sweats and a dry cough. Vital signs reveal a blood pressure of 154/93.  The differential diagnosis includes torticollis, pulmonary embolism, musculoskeletal pain, cervical radiculopathy, and less likely, myocardial infarction or pneumonia. Initial workup included a chest X-ray, EKG, and blood tests, all of which were unremarkable, ruling out pneumonia, collapsed lung, and acute cardiac events. The troponins and EKG, interpreted by me, showed no acute ischemic changes. Given the patient's use of birth control and the presence of leg swelling, a D-dimer test is warranted to rule out pulmonary embolism.  D dimer elevated so CTA performed.  CT negative for pulmonary embolus, pneumonia or any other acute etiology.  Patient is maintained on monitor for couple hours without any hypoxia or any evidence of distress.  Patient was resting comfortably.  I suspect this is likely torticollis rather than  a primary chest issue.  Will treat as same.  Close PCP follow-up or return to the ER for new or worsening symptoms discussed.   Final diagnoses:  Torticollis    ED Discharge Orders          Ordered    methocarbamol  (ROBAXIN ) 500 MG tablet  Every 8 hours PRN        10/17/24 0614    naproxen  (NAPROSYN ) 500 MG tablet  2 times daily        10/17/24 0614               Joven Mom, Selinda, MD 10/18/24 2308
